# Patient Record
Sex: Female | Born: 1990 | Race: Black or African American | Hispanic: No | Marital: Single | Smoking: Never smoker
Health system: Southern US, Community
[De-identification: ages and names within clinical notes are randomized; demographics above are authoritative.]

## PROBLEM LIST (undated history)

## (undated) DIAGNOSIS — T7840XA Allergy, unspecified, initial encounter: Secondary | ICD-10-CM

## (undated) DIAGNOSIS — E282 Polycystic ovarian syndrome: Secondary | ICD-10-CM

## (undated) DIAGNOSIS — N200 Calculus of kidney: Secondary | ICD-10-CM

## (undated) DIAGNOSIS — F32A Depression, unspecified: Secondary | ICD-10-CM

## (undated) DIAGNOSIS — F419 Anxiety disorder, unspecified: Secondary | ICD-10-CM

## (undated) HISTORY — PX: APPENDECTOMY: SHX54

## (undated) HISTORY — DX: Anxiety disorder, unspecified: F41.9

## (undated) HISTORY — DX: Allergy, unspecified, initial encounter: T78.40XA

## (undated) HISTORY — DX: Depression, unspecified: F32.A

---

## 2014-12-03 ENCOUNTER — Encounter: Payer: Self-pay | Admitting: Emergency Medicine

## 2014-12-03 ENCOUNTER — Ambulatory Visit
Admission: EM | Admit: 2014-12-03 | Discharge: 2014-12-03 | Disposition: A | Discharge status: Home / Self Care | Payer: 59 | Attending: Family Medicine | Admitting: Family Medicine

## 2014-12-03 DIAGNOSIS — H6123 Impacted cerumen, bilateral: Secondary | ICD-10-CM | POA: Diagnosis not present

## 2014-12-03 NOTE — ED Provider Notes (Signed)
CSN: [id number]     Arrival date & time 12/03/14  1239 History   First MD Initiated Contact with Patient 12/03/14 1326     Chief Complaint  Patient presents with  . Otalgia    right   (Consider location/radiation/quality/duration/timing/severity/associated sxs/prior Treatment) HPI 24 year old female presents complaining of bilateral ear discomfort, worse on the right. She has a history of frequent cerumen impactions, she usually rates as this out at home with a syringe and warm water. She has not been able to fix at this time so she came in to be seen. She denies any pain or drainage. No systemic symptoms. No hearing loss  History reviewed. No pertinent past medical history. Past Surgical History  Procedure Laterality Date  . Appendectomy     History reviewed. No pertinent family history. History  Substance Use Topics  . Smoking status: Never Smoker   . Smokeless tobacco: Never Used  . Alcohol Use: Yes   OB History    No data available     Review of Systems  HENT: Negative for ear discharge, ear pain and hearing loss.        Cerumen buildup   All other systems reviewed and are negative.   Allergies  Sulfa antibiotics; Lactose intolerance (gi); and Nickel  Home Medications   Prior to Admission medications   Not on File   BP 122/75 mmHg  Pulse 83  Temp(Src) 97 F (36.1 C) (Tympanic)  Resp 16  Ht 5\' 3"  (1.6 m)  Wt 180 lb (81.647 kg)  BMI 31.89 kg/m2  SpO2 100%  LMP 12/02/2014 (Exact Date) Physical Exam  Constitutional: She is oriented to person, place, and time. Vital signs are normal. She appears well-developed and well-nourished. No distress.  HENT:  Head: Normocephalic and atraumatic.  Right Ear: External ear normal.  Left Ear: External ear normal.  Nose: Nose normal.  Mouth/Throat: Oropharynx is clear and moist. No oropharyngeal exudate.  Bilateral cerumen impaction   Pulmonary/Chest: Effort normal. No respiratory distress.  Neurological: She is alert and  oriented to person, place, and time. She has normal strength. Coordination normal.  Skin: Skin is warm and dry. No rash noted. She is not diaphoretic.  Psychiatric: She has a normal mood and affect. Judgment normal.  Nursing note and vitals reviewed.   ED Course  Procedures (including critical care time) Labs Review Labs Reviewed - No data to display  Imaging Review No results found.   MDM   1. Cerumen impaction, bilateral    Cerumen rinsed out, symptoms have resolved. No signs of any infection. Follow-up when necessary    Graylon GoodZachary H Carlus Stay, PA-C 12/03/14 1343

## 2014-12-03 NOTE — Discharge Instructions (Signed)
Cerumen Impaction °A cerumen impaction is when the wax in your ear forms a plug. This plug usually causes reduced hearing. Sometimes it also causes an earache or dizziness. Removing a cerumen impaction can be difficult and painful. The wax sticks to the ear canal. The canal is sensitive and bleeds easily. If you try to remove a heavy wax buildup with a cotton tipped swab, you may push it in further. °Irrigation with water, suction, and small ear curettes may be used to clear out the wax. If the impaction is fixed to the skin in the ear canal, ear drops may be needed for a few days to loosen the wax. People who build up a lot of wax frequently can use ear wax removal products available in your local drugstore. °SEEK MEDICAL CARE IF:  °You develop an earache, increased hearing loss, or marked dizziness. °Document Released: 07/29/2004 Document Revised: 09/13/2011 Document Reviewed: 09/18/2009 °ExitCare® Patient Information ©2015 [employer]. This information is not intended to replace advice given to you by your health care provider. Make sure you discuss any questions you have with your health care provider. ° °

## 2014-12-03 NOTE — ED Notes (Signed)
Patient c/o right ear pain for three days.  Patient denies fevers.

## 2016-11-26 ENCOUNTER — Encounter (HOSPITAL_COMMUNITY): Payer: Self-pay | Admitting: Emergency Medicine

## 2016-11-26 ENCOUNTER — Emergency Department (HOSPITAL_COMMUNITY)
Admission: EM | Admit: 2016-11-26 | Discharge: 2016-11-26 | Disposition: A | Discharge status: Home / Self Care | Payer: 59 | Attending: Emergency Medicine | Admitting: Emergency Medicine

## 2016-11-26 DIAGNOSIS — H6691 Otitis media, unspecified, right ear: Secondary | ICD-10-CM | POA: Insufficient documentation

## 2016-11-26 DIAGNOSIS — H9201 Otalgia, right ear: Secondary | ICD-10-CM | POA: Diagnosis present

## 2016-11-26 DIAGNOSIS — H60501 Unspecified acute noninfective otitis externa, right ear: Secondary | ICD-10-CM

## 2016-11-26 MED ORDER — AMOXICILLIN 500 MG PO CAPS: 500.0000 mg | ORAL_CAPSULE | Freq: Three times a day (TID) | ORAL | 0 refills | Status: DC

## 2016-11-26 MED ORDER — CIPROFLOXACIN-DEXAMETHASONE 0.3-0.1 % OT SUSP: 4.0000 [drp] | Freq: Two times a day (BID) | OTIC | 0 refills | Status: DC

## 2016-11-26 NOTE — ED Triage Notes (Signed)
Pt reports right sided ear pain that started on Monday and became worse tonight. Pt reports using ear ache drops from [facility] with no relief.

## 2016-11-26 NOTE — ED Notes (Signed)
Patient complaining of right ear pain that started a half hour ago. Patient states pain is 6/10. Patient has not had any injury to right ear.

## 2016-11-26 NOTE — ED Provider Notes (Signed)
[facility]-EMERGENCY DEPT Provider Note   CSN: [id number] Arrival date & time: 11/26/16  0343     History   Chief Complaint Chief Complaint  Patient presents with  . Otalgia    HPI Rebekah Fields is a 26 y.o. female.  Patient presents to the emergency department with chief complaint of right-sided ear pain. She states the pain started on Monday and has gradually worsened. Tonight it awakened her from sleep with severe pain. She denies having gone swimming. She denies any fevers, chills, or other associated symptoms. She has not taken anything for symptoms.    The history is provided by the patient. No language interpreter was used.    History reviewed. No pertinent past medical history.  There are no active problems to display for this patient.   Past Surgical History:  Procedure Laterality Date  . APPENDECTOMY      OB History    No data available       Home Medications    Prior to Admission medications   Not on File    Family History History reviewed. No pertinent family history.  Social History Social History  Substance Use Topics  . Smoking status: Never Smoker  . Smokeless tobacco: Never Used  . Alcohol use Yes     Allergies   Sulfa antibiotics; Lactose intolerance (gi); and Nickel   Review of Systems Review of Systems  All other systems reviewed and are negative.    Physical Exam Updated Vital Signs BP 125/79 (BP Location: Left Arm)   Pulse 83   Temp 98.4 F (36.9 C) (Oral)   Resp 18   Ht 5\' 3"  (1.6 m)   Wt 85.3 kg (188 lb)   LMP 11/01/2016   SpO2 100%   BMI 33.30 kg/m   Physical Exam  Constitutional: She is oriented to person, place, and time. No distress.  HENT:  Head: Normocephalic and atraumatic.  Right ear canal is edematous with debris, right TM as not visualized  Left ear canal is clear, left TM is clear  Eyes: Conjunctivae and EOM are normal. Pupils are equal, round, and reactive to light.  Neck: No tracheal deviation  present.  Cardiovascular: Normal rate.   Pulmonary/Chest: Effort normal. No respiratory distress.  Abdominal: Soft.  Musculoskeletal: Normal range of motion.  Neurological: She is alert and oriented to person, place, and time.  Skin: Skin is warm and dry. She is not diaphoretic.  Psychiatric: Judgment normal.  Nursing note and vitals reviewed.    ED Treatments / Results  Labs (all labs ordered are listed, but only abnormal results are displayed) Labs Reviewed - No data to display  EKG  EKG Interpretation None       Radiology No results found.  Procedures Procedures (including critical care time)  Medications Ordered in ED Medications - No data to display   Initial Impression / Assessment and Plan / ED Course  I have reviewed the triage vital signs and the nursing notes.  Pertinent labs & imaging results that were available during my care of the patient were reviewed by me and considered in my medical decision making (see chart for details).     Patient with right-sided otalgia. Physical exam concerning for otitis externa, I am unable to visualize the tympanic membrane, and will treat with amoxicillin.  Recommend PCP follow-up.  Final Clinical Impressions(s) / ED Diagnoses   Final diagnoses:  Acute otitis externa of right ear, unspecified type  Right ear pain    New Prescriptions  New Prescriptions   AMOXICILLIN (AMOXIL) 500 MG CAPSULE    Take 1 capsule (500 mg total) by mouth 3 (three) times daily.   CIPROFLOXACIN-DEXAMETHASONE (CIPRODEX) OTIC SUSPENSION    Place 4 drops into the right ear 2 (two) times daily.     Roxy HorsemanBrowning, Minervia Osso, PA-C 11/26/16 [id number]    Gilda CreasePollina, Christopher J, MD 11/26/16 [phone]

## 2017-05-30 ENCOUNTER — Emergency Department (HOSPITAL_COMMUNITY): Payer: 59

## 2017-05-30 ENCOUNTER — Encounter (HOSPITAL_COMMUNITY): Payer: Self-pay

## 2017-05-30 ENCOUNTER — Emergency Department (HOSPITAL_COMMUNITY)
Admission: EM | Admit: 2017-05-30 | Discharge: 2017-05-30 | Disposition: A | Discharge status: Home / Self Care | Payer: 59 | Attending: Emergency Medicine | Admitting: Emergency Medicine

## 2017-05-30 DIAGNOSIS — Z79899 Other long term (current) drug therapy: Secondary | ICD-10-CM | POA: Diagnosis not present

## 2017-05-30 DIAGNOSIS — R109 Unspecified abdominal pain: Secondary | ICD-10-CM | POA: Diagnosis present

## 2017-05-30 DIAGNOSIS — N200 Calculus of kidney: Secondary | ICD-10-CM | POA: Insufficient documentation

## 2017-05-30 LAB — URINALYSIS, ROUTINE W REFLEX MICROSCOPIC
Bilirubin Urine: NEGATIVE
GLUCOSE, UA: NEGATIVE mg/dL
Ketones, ur: NEGATIVE mg/dL
NITRITE: NEGATIVE
Protein, ur: NEGATIVE mg/dL
Specific Gravity, Urine: <1.005 — ABNORMAL LOW (ref 1.005–1.030)
pH: 6 (ref 5.0–8.0)

## 2017-05-30 LAB — CBC
HEMATOCRIT: 39.6 % (ref 36.0–46.0)
Hemoglobin: 13.2 g/dL (ref 12.0–15.0)
MCH: 28.7 pg (ref 26.0–34.0)
MCHC: 33.3 g/dL (ref 30.0–36.0)
MCV: 86.1 fL (ref 78.0–100.0)
Platelets: 383 10*3/uL (ref 150–400)
RBC: 4.6 MIL/uL (ref 3.87–5.11)
RDW: 12.7 % (ref 11.5–15.5)
WBC: 10.2 10*3/uL (ref 4.0–10.5)

## 2017-05-30 LAB — URINALYSIS, MICROSCOPIC (REFLEX)

## 2017-05-30 LAB — BASIC METABOLIC PANEL
Anion gap: 7 (ref 5–15)
BUN: 15 mg/dL (ref 6–20)
CHLORIDE: 103 mmol/L (ref 101–111)
CO2: 26 mmol/L (ref 22–32)
Calcium: 9.6 mg/dL (ref 8.9–10.3)
Creatinine, Ser: 0.74 mg/dL (ref 0.44–1.00)
Glucose, Bld: 107 mg/dL — ABNORMAL HIGH (ref 65–99)
POTASSIUM: 3.9 mmol/L (ref 3.5–5.1)
SODIUM: 136 mmol/L (ref 135–145)

## 2017-05-30 LAB — HCG, QUANTITATIVE, PREGNANCY

## 2017-05-30 MED ORDER — KETOROLAC TROMETHAMINE 60 MG/2ML IM SOLN
30.0000 mg | Freq: Once | INTRAMUSCULAR | Status: AC
  Administered 2017-05-30: 30 mg via INTRAMUSCULAR
  Filled 2017-05-30: qty 2

## 2017-05-30 MED ORDER — DICLOFENAC SODIUM ER 100 MG PO TB24: 100.0000 mg | ORAL_TABLET | Freq: Every day | ORAL | 0 refills | Status: DC

## 2017-05-30 MED ORDER — TAMSULOSIN HCL 0.4 MG PO CAPS
0.4000 mg | ORAL_CAPSULE | Freq: Every day | ORAL | Status: DC
  Administered 2017-05-30: 0.4 mg via ORAL
  Filled 2017-05-30: qty 1

## 2017-05-30 NOTE — ED Triage Notes (Signed)
Pt went to the restroom in triage with a strainer and she has two specks in the strainer and she feels better

## 2017-05-30 NOTE — ED Notes (Signed)
Bed: [facility] Expected date:  Expected time:  Means of arrival:  Comments: 

## 2017-05-30 NOTE — ED Notes (Signed)
Pt complains of sharp flank pain that woke her up about 2 hours ago, pt has hx of kidney stones Pt vomited once, denies hematuria

## 2017-05-30 NOTE — ED Provider Notes (Signed)
[facility]-EMERGENCY DEPT Provider Note   CSN: [id number] Arrival date & time: 05/30/17  0219     History   Chief Complaint Chief Complaint  Patient presents with  . Flank Pain    HPI Rebekah Fields is a 26 y.o. female.  The history is provided by the patient.  Flank Pain  This is a recurrent problem. The current episode started 1 to 2 hours ago. The problem occurs constantly. The problem has been resolved. Pertinent negatives include no chest pain, no abdominal pain, no headaches and no shortness of breath. Nothing aggravates the symptoms. Nothing relieves the symptoms. She has tried nothing for the symptoms. The treatment provided no relief.  Passed the stone while waiting.  Pain has resolved.    History reviewed. No pertinent past medical history.  There are no active problems to display for this patient.   Past Surgical History:  Procedure Laterality Date  . APPENDECTOMY      OB History    No data available       Home Medications    Prior to Admission medications   Medication Sig Start Date End Date Taking? Authorizing Provider  Diclofenac Sodium CR (VOLTAREN-XR) 100 MG 24 hr tablet Take 1 tablet (100 mg total) by mouth daily. 05/30/17   Monti Jilek, MD    Family History History reviewed. No pertinent family history.  Social History Social History   Tobacco Use  . Smoking status: Never Smoker  . Smokeless tobacco: Never Used  Substance Use Topics  . Alcohol use: Yes  . Drug use: No     Allergies   Sulfa antibiotics; Lactose intolerance (gi); and Nickel   Review of Systems Review of Systems  Constitutional: Negative for fever.  Respiratory: Negative for shortness of breath.   Cardiovascular: Negative for chest pain.  Gastrointestinal: Positive for vomiting. Negative for abdominal pain.  Genitourinary: Positive for flank pain. Negative for frequency.  Neurological: Negative for headaches.  All other systems  reviewed and are negative.    Physical Exam Updated Vital Signs BP 111/74 (BP Location: Left Arm)   Pulse 86   Temp 98.6 F (37 C) (Oral)   Resp 18   SpO2 99%   Physical Exam  Constitutional: She is oriented to person, place, and time. She appears well-developed and well-nourished. No distress.  HENT:  Head: Normocephalic and atraumatic.  Mouth/Throat: No oropharyngeal exudate.  Eyes: Conjunctivae are normal. Pupils are equal, round, and reactive to light.  Neck: Normal range of motion. Neck supple. No JVD present.  Cardiovascular: Normal rate, regular rhythm, normal heart sounds and intact distal pulses.  Pulmonary/Chest: Effort normal and breath sounds normal. No stridor. She has no wheezes. She has no rales.  Abdominal: Soft. Bowel sounds are normal. She exhibits no mass. There is no tenderness. There is no rebound and no guarding.  Musculoskeletal: Normal range of motion.  Neurological: She is alert and oriented to person, place, and time.  Skin: Skin is warm and dry. Capillary refill takes less than 2 seconds.  Psychiatric: She has a normal mood and affect.     ED Treatments / Results   Vitals:   05/30/17 0221  BP: 111/74  Pulse: 86  Resp: 18  Temp: 98.6 F (37 C)  SpO2: 99%    Labs (all labs ordered are listed, but only abnormal results are displayed)  Results for orders placed or performed during the hospital encounter of 05/30/17  Urinalysis, Routine w reflex microscopic- may I&O cath  if menses  Result Value Ref Range   Color, Urine YELLOW YELLOW   APPearance CLEAR CLEAR   Specific Gravity, Urine <1.005 (L) 1.005 - 1.030   pH 6.0 5.0 - 8.0   Glucose, UA NEGATIVE NEGATIVE mg/dL   Hgb urine dipstick LARGE (A) NEGATIVE   Bilirubin Urine NEGATIVE NEGATIVE   Ketones, ur NEGATIVE NEGATIVE mg/dL   Protein, ur NEGATIVE NEGATIVE mg/dL   Nitrite NEGATIVE NEGATIVE   Leukocytes, UA TRACE (A) NEGATIVE  hCG, quantitative, pregnancy  Result Value Ref Range   hCG,  Beta Chain, Quant, S <1 <5 mIU/mL  Basic metabolic panel  Result Value Ref Range   Sodium 136 135 - 145 mmol/L   Potassium 3.9 3.5 - 5.1 mmol/L   Chloride 103 101 - 111 mmol/L   CO2 26 22 - 32 mmol/L   Glucose, Bld 107 (H) 65 - 99 mg/dL   BUN 15 6 - 20 mg/dL   Creatinine, Ser 1.61 0.44 - 1.00 mg/dL   Calcium 9.6 8.9 - 09.6 mg/dL   GFR calc non Af Amer >60 >60 mL/min   GFR calc Af Amer >60 >60 mL/min   Anion gap 7 5 - 15  CBC  Result Value Ref Range   WBC 10.2 4.0 - 10.5 K/uL   RBC 4.60 3.87 - 5.11 MIL/uL   Hemoglobin 13.2 12.0 - 15.0 g/dL   HCT 04.5 40.9 - 81.1 %   MCV 86.1 78.0 - 100.0 fL   MCH 28.7 26.0 - 34.0 pg   MCHC 33.3 30.0 - 36.0 g/dL   RDW 91.4 78.2 - 95.6 %   Platelets 383 150 - 400 K/uL  Urinalysis, Microscopic (reflex)  Result Value Ref Range   RBC / HPF 6-30 0 - 5 RBC/hpf   WBC, UA 0-5 0 - 5 WBC/hpf   Bacteria, UA RARE (A) NONE SEEN   Squamous Epithelial / LPF 0-5 (A) NONE SEEN   Ct Renal Stone Study  Result Date: 05/30/2017 CLINICAL DATA:  Left flank pain, vomiting and hematuria EXAM: CT ABDOMEN AND PELVIS WITHOUT CONTRAST TECHNIQUE: Multidetector CT imaging of the abdomen and pelvis was performed following the standard protocol without IV contrast. COMPARISON:  None. FINDINGS: Lower chest: No pulmonary nodules or pleural effusion. No visible pericardial effusion. Hepatobiliary: Normal hepatic contours and density. No visible biliary dilatation. Normal gallbladder. Pancreas: Normal contours without ductal dilatation. No peripancreatic fluid collection. Spleen: Normal. Adrenals/Urinary Tract: --Adrenal glands: Normal. --Right kidney/ureter: There are approximately 4 nonobstructive right renal calculi, the largest of which is in the interpolar region and measures 4 mm. There is no hydronephrosis. The right ureter is of normal caliber and is unobstructed. --Left kidney/ureter: There is a nonobstructing calculus at the upper pole the left kidney, measuring up to 4 mm.  There is no left hydronephrosis or perinephric stranding. The left ureter is unobstructed. There is a phlebolith adjacent to the distal left ureter, but no ureterolithiasis. --Urinary bladder: Unremarkable. Stomach/Bowel: --Stomach/Duodenum: No hiatal hernia or other gastric abnormality. Normal duodenal course and caliber. --Small bowel: No dilatation or inflammation. --Colon: No focal abnormality. --Appendix: Surgically absent. Vascular/Lymphatic: Normal course and caliber of the major abdominal vessels. No abdominal or pelvic lymphadenopathy. Reproductive: Normal uterus and ovaries. Musculoskeletal. No bony spinal canal stenosis or focal osseous abnormality. Other: None. IMPRESSION: 1. No obstructive uropathy or other acute abdominopelvic abnormality. 2. Bilateral nonobstructive nephrolithiasis with stones measuring up to 4 mm. Electronically Signed   By: Deatra Robinson M.D.   On: 05/30/2017 05:09  Radiology Ct Renal Stone Study  Result Date: 05/30/2017 CLINICAL DATA:  Left flank pain, vomiting and hematuria EXAM: CT ABDOMEN AND PELVIS WITHOUT CONTRAST TECHNIQUE: Multidetector CT imaging of the abdomen and pelvis was performed following the standard protocol without IV contrast. COMPARISON:  None. FINDINGS: Lower chest: No pulmonary nodules or pleural effusion. No visible pericardial effusion. Hepatobiliary: Normal hepatic contours and density. No visible biliary dilatation. Normal gallbladder. Pancreas: Normal contours without ductal dilatation. No peripancreatic fluid collection. Spleen: Normal. Adrenals/Urinary Tract: --Adrenal glands: Normal. --Right kidney/ureter: There are approximately 4 nonobstructive right renal calculi, the largest of which is in the interpolar region and measures 4 mm. There is no hydronephrosis. The right ureter is of normal caliber and is unobstructed. --Left kidney/ureter: There is a nonobstructing calculus at the upper pole the left kidney, measuring up to 4 mm. There is  no left hydronephrosis or perinephric stranding. The left ureter is unobstructed. There is a phlebolith adjacent to the distal left ureter, but no ureterolithiasis. --Urinary bladder: Unremarkable. Stomach/Bowel: --Stomach/Duodenum: No hiatal hernia or other gastric abnormality. Normal duodenal course and caliber. --Small bowel: No dilatation or inflammation. --Colon: No focal abnormality. --Appendix: Surgically absent. Vascular/Lymphatic: Normal course and caliber of the major abdominal vessels. No abdominal or pelvic lymphadenopathy. Reproductive: Normal uterus and ovaries. Musculoskeletal. No bony spinal canal stenosis or focal osseous abnormality. Other: None. IMPRESSION: 1. No obstructive uropathy or other acute abdominopelvic abnormality. 2. Bilateral nonobstructive nephrolithiasis with stones measuring up to 4 mm. Electronically Signed   By: Deatra RobinsonKevin  Herman M.D.   On: 05/30/2017 05:09    Procedures Procedures (including critical care time)  Medications Ordered in ED Medications  tamsulosin (FLOMAX) capsule 0.4 mg (0.4 mg Oral Given 05/30/17 0447)  ketorolac (TORADOL) injection 30 mg (30 mg Intramuscular Given 05/30/17 0447)      Final Clinical Impressions(s) / ED Diagnoses   Final diagnoses:  Kidney stone   All questions answered to the patient's satisfaction.   Strict return precautions for fever, inability to open the mouth, inability to speech, swelling behind the ear or in the neck, fevers,  global weakness, vomiting, swelling or the lips or tongue, chest pain, dyspnea on exertion,shortness of breath, persistent pain, Inability to tolerate liquids or food, cough, altered mental status or any concerns. No signs of systemic illness or infection. The patient is nontoxic-appearing on exam and vital signs are within normal limits.    I have reviewed the triage vital signs and the nursing notes. Pertinent labs &imaging results that were available during my care of the patient were  reviewed by me and considered in my medical decision making (see chart for details).  After history, exam, and medical workup I feel the patient has been appropriately medically screened and is safe for discharge home. Pertinent diagnoses were discussed with the patient. Patient was given return precautions ED Discharge Orders        Ordered    Diclofenac Sodium CR (VOLTAREN-XR) 100 MG 24 hr tablet  Daily     05/30/17 0542       Seng Larch, MD 05/30/17 [phone]

## 2017-08-30 ENCOUNTER — Encounter (HOSPITAL_COMMUNITY): Payer: Self-pay | Admitting: Emergency Medicine

## 2017-08-30 ENCOUNTER — Emergency Department (HOSPITAL_COMMUNITY)
Admission: EM | Admit: 2017-08-30 | Discharge: 2017-08-30 | Disposition: A | Discharge status: Home / Self Care | Payer: 59 | Attending: Emergency Medicine | Admitting: Emergency Medicine

## 2017-08-30 DIAGNOSIS — Z79899 Other long term (current) drug therapy: Secondary | ICD-10-CM | POA: Insufficient documentation

## 2017-08-30 DIAGNOSIS — F419 Anxiety disorder, unspecified: Secondary | ICD-10-CM | POA: Diagnosis not present

## 2017-08-30 MED ORDER — HYDROXYZINE HCL 25 MG PO TABS: 25.0000 mg | ORAL_TABLET | Freq: Four times a day (QID) | ORAL | 0 refills | Status: DC | PRN

## 2017-08-30 NOTE — Discharge Instructions (Signed)
Take Hydroxyzine as needed for anxiety or before sleep Follow up with your counselor Return if worsening

## 2017-08-30 NOTE — ED Provider Notes (Signed)
[facility]-EMERGENCY DEPT Provider Note   CSN: [id number] Arrival date & time: 08/30/17  1331     History   Chief Complaint Chief Complaint  Patient presents with  . Anxiety    HPI Rebekah Fields is a 27 y.o. female who presents with anxiety.  No significant past medical history.  She states that since Friday she has had severe recurrent panic attacks every time she goes home or thinks about going back to her apartment.  She is afraid of being alone with her thoughts and has not been able to break the cycle of anxiety.  She is very upset that she is not able to control her thoughts and states that typically she is the type of person who people come to for help.  She is tried multiple different coping techniques suggested by her mother who is a Veterinary surgeoncounselor but she is not having any success.  She went to a counselor today and after the counselor asked her about going home she had another panic attack therefore she was advised to come to the ED for medication management versus observation.  Patient denies suicidal, homicidal, audiovisual hallucinations.   she denies any specific physical complaints other than when she has the panic attack she feels like her whole body is going numb and gets very sweaty and anxious.  She denies any medication use, supplement use, drug or alcohol use.  She has never had severe anxiety like this before but states that she has had a great increase in stress in her work and personal life over the past week.  She has been going to her parents home is about 40 minutes away because she is afraid to be alone. No hx of inpatient tx in the past.  HPI  History reviewed. No pertinent past medical history.  There are no active problems to display for this patient.   Past Surgical History:  Procedure Laterality Date  . APPENDECTOMY      OB History    No data available       Home Medications    Prior to Admission medications   Medication Sig  Start Date End Date Taking? Authorizing Provider  Diclofenac Sodium CR (VOLTAREN-XR) 100 MG 24 hr tablet Take 1 tablet (100 mg total) by mouth daily. 05/30/17   Palumbo, April, MD  hydrOXYzine (ATARAX/VISTARIL) 25 MG tablet Take 1 tablet (25 mg total) by mouth every 6 (six) hours as needed. 08/30/17   Bethel BornGekas, Yaritzel Stange Marie, PA-C    Family History No family history on file.  Social History Social History   Tobacco Use  . Smoking status: Never Smoker  . Smokeless tobacco: Never Used  Substance Use Topics  . Alcohol use: Yes  . Drug use: No     Allergies   Sulfa antibiotics; Lactose intolerance (gi); and Nickel   Review of Systems Review of Systems  Constitutional: Negative for fever.  Respiratory: Negative for shortness of breath.   Cardiovascular: Negative for chest pain.  Gastrointestinal: Negative for abdominal pain.  Psychiatric/Behavioral: Positive for dysphoric mood and sleep disturbance. Negative for confusion, hallucinations, self-injury and suicidal ideas. The patient is nervous/anxious.   All other systems reviewed and are negative.    Physical Exam Updated Vital Signs BP 123/90 (BP Location: Left Arm)   Pulse 85   Temp 98.6 F (37 C) (Oral)   Resp 17   SpO2 96%   Physical Exam  Constitutional: She is oriented to person, place, and time. She appears well-developed  and well-nourished. No distress.  Tearful at times  HENT:  Head: Normocephalic and atraumatic.  Eyes: Conjunctivae are normal. Pupils are equal, round, and reactive to light. Right eye exhibits no discharge. Left eye exhibits no discharge. No scleral icterus.  Neck: Normal range of motion.  Cardiovascular: Normal rate.  Pulmonary/Chest: Effort normal. No respiratory distress.  Abdominal: She exhibits no distension.  Neurological: She is alert and oriented to person, place, and time.  Skin: Skin is warm and dry.  Psychiatric: She has a normal mood and affect. Her behavior is normal.  Nursing note  and vitals reviewed.    ED Treatments / Results  Labs (all labs ordered are listed, but only abnormal results are displayed) Labs Reviewed - No data to display  EKG  EKG Interpretation None       Radiology No results found.  Procedures Procedures (including critical care time)  Medications Ordered in ED Medications - No data to display   Initial Impression / Assessment and Plan / ED Course  I have reviewed the triage vital signs and the nursing notes.  Pertinent labs & imaging results that were available during my care of the patient were reviewed by me and considered in my medical decision making (see chart for details).  27 year old female presents with recurrent panic attacks.  Vital signs are normal.  She is well-appearing.  She feels that her anxiety is interfering with her life and feels out of control. Reassurance was given and coping techniques were discussed. She was offered prescription for Atarax versus TTS consult and she wants to try the Atarax first.  She was advised to return if worsening.  She is also advised to follow-up with her counselor.  Final Clinical Impressions(s) / ED Diagnoses   Final diagnoses:  Anxiety    ED Discharge Orders        Ordered    hydrOXYzine (ATARAX/VISTARIL) 25 MG tablet  Every 6 hours PRN     08/30/17 1427       Bethel Born, PA-C 08/30/17 1516    Tilden Fossa, MD 08/31/17 [phone]

## 2017-08-30 NOTE — ED Triage Notes (Addendum)
Patient comes from therapist office stating that she was told to come here since she is having anxiety and panic attacks since Friday.  Patient reports that she was sleeping Friday when she woke up in panic attack and couldn't go back to sleep.  Reports that every time she has to go back to her apartment or thinks about having to go back to her apartment, she will have a panic attack.  Patient lives alone and lived in same apartment since May last year. Denies any one threatening or harming her. Just afraid that she will have another panic attack and be there alone with her thoughts. Denies SI or HI.  Pt adds that she has been driving to her parents home over 30min away to sleep since Friday.

## 2017-08-30 NOTE — ED Notes (Signed)
Bed: [facility] Expected date:  Expected time:  Means of arrival:  Comments: 

## 2017-08-31 ENCOUNTER — Encounter (HOSPITAL_COMMUNITY): Payer: Self-pay | Admitting: Emergency Medicine

## 2017-08-31 ENCOUNTER — Ambulatory Visit (HOSPITAL_COMMUNITY)
Admission: RE | Admit: 2017-08-31 | Discharge: 2017-08-31 | Disposition: A | Discharge status: Home / Self Care | Payer: 59 | Attending: Psychiatry | Admitting: Psychiatry

## 2017-08-31 DIAGNOSIS — F411 Generalized anxiety disorder: Secondary | ICD-10-CM | POA: Diagnosis not present

## 2017-08-31 NOTE — BH Assessment (Signed)
Assessment Note  Rebekah Fields is an 27 y.o. female. Patient presents voluntarily to [facility] for assessment. She is pleasant and oriented x 4. She reports she has experienced severe anxiety with panic attacks since 08/25/17. 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, Consulting civil engineer apartment complex. She says they have been short staffed at work, so work has been more stressful than usual. Pt reports in the past week, her car has broken down twice and she passed a kidney stone at work. She reports she has experiencing anxiety in the past but nothing as intense as the past week. Pt says she saw a therapist a couple of times during college after a coworker committed suicide. Pt says the only other time she has been a counselor was her initial appointment yesterday with Rebekah Fields LPC. She reports when Rebekah Fields began talking about patient going back to her apartment to sleep, pt had a panic attack during therapy session. Pt says she has been driving 40 mins to her parents' home every night this week rather than sleeping alone in her apartment. Pt says that she has a fear of going home to her apartment and something happening to her and no one would be there to help her. She says she has had a fear of death since suicide of her coworker few years ago. Pt reports she had two panic attacks today. Per chart review, pt presented to [facility] last night with panic attack. She says her counselor encouraged her to go to ED as pt would be able to be prescribed meds for anxiety. Pt says she took the hydroxyzine last night which made her sleepy. Pt says she was able to sleep at her apartment last night since she was so drowsy. She reports during her panic attacks she cries uncontrollably and her legs feel numb and she feels terrified. Pt denies any current or past substance abuse problems. Pt does not appear to be intoxicated or in withdrawal at this time. Pt denies homicidal thoughts or physical aggression. Pt denies having access to firearms.  Pt denies having any legal problems at this time. Pt denies hallucinations. Pt does not appear to be responding to internal stimuli and exhibits no delusional thought. Pt's reality testing appears to be intact. She reports interest in med management for anxiety.  Collateral info provided by pt's mother Rebekah Fields who is present during assessment. 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 Mom says she has been working with patient this week and coping techniques and identifying triggers. Mom is therapist with student services.   Diagnosis: Generalized Anxiety Disorder with Panic Attacks  Past Medical History: No past medical history on file.  Past Surgical History:  Procedure Laterality Date  . APPENDECTOMY      Family History: No family history on file.  Social History:  reports that  has never smoked. she has never used smokeless tobacco. She reports that she does not drink alcohol or use drugs.  Additional Social History:  Alcohol / Drug Use Pain Medications: pt denies abuse - see pta meds list Prescriptions: pt denies abuse - see pta meds list Over the Counter: pt denies abuse - see pta meds list History of alcohol / drug use?: No history of alcohol / drug abuse Longest period of sobriety (when/how long): none  CIWA:   COWS:    Allergies:  Allergies  Allergen Reactions  . Sulfa Antibiotics Swelling  . Lactose Intolerance (Gi) Other (See Comments)  diarrhea  . Nickel Itching    Home Medications:  (Not in a hospital admission)  OB/GYN Status:  No LMP recorded.  General Assessment Data [facility] Assessment: In system Is this a Tele or Face-to-Face Assessment?: Face-to-Face Is this an Initial Assessment or a Re-assessment for this encounter?: Initial Assessment Marital status: Single Maiden name: Rebekah Fields Is patient pregnant?: No Pregnancy Status: No Living Arrangements:  Alone Can pt return to current living arrangement?: Yes Admission Status: Voluntary Is patient capable of signing voluntary admission?: Yes Referral Source: Self/Family/Friend Insurance type: united healthcare  Medical Screening Exam [facility] Walk-in ONLY) Medical Exam completed: Yes(Rebekah simon pa-c)  Crisis Care Plan Living Arrangements: Alone Name of Psychiatrist: none Name of Therapist: [facility]  Education Status Is patient currently in school?: No Highest grade of school patient has completed: 16(plus graduate certificate in Health ) Name of school: [employer]   Risk to self with the past 6 months Suicidal Ideation: No Has patient been a risk to self within the past 6 months prior to admission? : No Suicidal Intent: No Has patient had any suicidal intent within the past 6 months prior to admission? : No Is patient at risk for suicide?: No Suicidal Plan?: No Has patient had any suicidal plan within the past 6 months prior to admission? : No Access to Means: No What has been your use of drugs/alcohol within the last 12 months?: none Previous Attempts/Gestures: No How many times?: 0 Other Self Harm Risks: none Triggers for Past Attempts: (n/a) Intentional Self Injurious Behavior: None Family Suicide History: No Recent stressful life event(s): Other (Comment)(short staffed at work, kidney stone, car broke down twice) Persecutory voices/beliefs?: No Depression: No Depression Symptoms: Feeling angry/irritable Substance abuse history and/or treatment for substance abuse?: No Suicide prevention information given to non-admitted patients: Not applicable  Risk to Others within the past 6 months Homicidal Ideation: No Does patient have any lifetime risk of violence toward others beyond the six months prior to admission? : No Thoughts of Harm to Others: No Current Homicidal Intent: No Current Homicidal Plan: No Access to Homicidal Means: No Identified Victim:  none History of harm to others?: No Assessment of Violence: None Noted Violent Behavior Description: pt denies history of violence Does patient have access to weapons?: No Criminal Charges Pending?: No Does patient have a court date: No Is patient on probation?: No  Psychosis Hallucinations: None noted Delusions: None noted  Mental Status Report Appearance/Hygiene: Unremarkable(in street clothes appropriate for weather) Eye Contact: Good Motor Activity: Freedom of movement Speech: Logical/coherent Level of Consciousness: Alert Mood: Anxious Affect: Appropriate to circumstance, Anxious(euthymic) Anxiety Level: Panic Attacks Panic attack frequency: daily Most recent panic attack: two attacks today Thought Processes: Relevant, Coherent Judgement: Unimpaired Orientation: Person, Place, Situation, Time Obsessive Compulsive Thoughts/Behaviors: None  Cognitive Functioning Concentration: Normal Memory: Remote Intact, Recent Intact IQ: Average Insight: Good Impulse Control: Fair Appetite: Poor Sleep: No Change Vegetative Symptoms: None  ADLScreening [facility] Assessment Services) Patient's cognitive ability adequate to safely complete daily activities?: Yes Patient able to express need for assistance with ADLs?: Yes Independently performs ADLs?: Yes (appropriate for developmental age)  Prior Inpatient Therapy Prior Inpatient Therapy: No  Prior Outpatient Therapy Prior Outpatient Therapy: Yes Prior Therapy Dates: briefly a few years ago and today Prior Therapy Facilty/Provider([facility] Reason for Treatment: anxiety, panic attacks Does patient have an ACCT team?: No Does patient have Intensive In-House Services?  : No Does patient have  [facility] services? : No Does patient have P4CC services?: No  ADL Screening (condition at time of admission) Patient's cognitive ability adequate to safely complete daily activities?: Yes Is the patient deaf or have difficulty  hearing?: No Does the patient have difficulty seeing, even when wearing glasses/contacts?: No Does the patient have difficulty concentrating, remembering, or making decisions?: No Patient able to express need for assistance with ADLs?: Yes Does the patient have difficulty dressing or bathing?: No Independently performs ADLs?: Yes (appropriate for developmental age) Does the patient have difficulty walking or climbing stairs?: No Weakness of Legs: None Weakness of Arms/Hands: None  Home Assistive Devices/Equipment Home Assistive Devices/Equipment: Eyeglasses    Abuse/Neglect Assessment (Assessment to be complete while patient is alone) Abuse/Neglect Assessment Can Be Completed: Yes Physical Abuse: Denies Verbal Abuse: Denies Sexual Abuse: Denies Exploitation of patient/patient's resources: Denies Self-Neglect: Denies     Merchant navy officer (For Healthcare) Does Patient Have a Medical Advance Directive?: No Would patient like information on creating a medical advance directive?: No - Patient declined    Additional Information 1:1 In Past 12 Months?: No CIRT Risk: No Elopement Risk: No Does patient have medical clearance?: No     Disposition:  Disposition Initial Assessment Completed for this Encounter: Yes Disposition of Patient: Outpatient treatment Type of outpatient treatment: Adult  Donell Sievert PA-C recommended outpatient treatment for pt as she doesn't meet inpatient criteria.  Upon Fields's discussion with patient, patient agrees to start med management with Melvenia Beam as he can begin med management with her quicker than the typical wait time to see a new mental health provider for med management. The plan is for Fields's office to contact pt directly by phone.    On Site Evaluation by:   Reviewed with Physician:    Thornell Sartorius 08/31/2017 10:44 PM

## 2017-08-31 NOTE — H&P (Signed)
Behavioral Health Medical Screening Exam  Rebekah Fields is an 27 y.o. female, presenting with her mother with c/o of increasing GAD symptoms , that include panic attacks, worrying and ruminating. She denies any acute ailments and or medical concerns. She denies depression, SI/SA or HI.  Total Time spent with patient: 20 minutes  Psychiatric Specialty Exam: Physical Exam  Constitutional: She is oriented to person, place, and time. She appears well-developed and well-nourished. No distress.  HENT:  Head: Normocephalic.  Eyes: Pupils are equal, round, and reactive to light.  Respiratory: Effort normal and breath sounds normal.  Neurological: She is alert and oriented to person, place, and time. No cranial nerve deficit.  Skin: Skin is warm and dry. She is not diaphoretic.  Psychiatric: Her speech is normal and behavior is normal. Judgment normal. Her mood appears anxious. Cognition and memory are normal. She expresses no homicidal and no suicidal ideation. She expresses no suicidal plans and no homicidal plans.    Review of Systems  Constitutional: Negative for chills, diaphoresis, fever, malaise/fatigue and weight loss.  Respiratory: Positive for shortness of breath.   Cardiovascular: Positive for palpitations.  Gastrointestinal: Positive for nausea.  Neurological: Positive for tingling. Negative for weakness.  Psychiatric/Behavioral: Negative for depression, hallucinations, substance abuse and suicidal ideas. The patient is nervous/anxious and has insomnia.     There were no vitals taken for this visit.There is no height or weight on file to calculate BMI.  General Appearance: Casual  Eye Contact:  Good  Speech:  Clear and Coherent  Volume:  Normal  Mood:  Anxious  Affect:  Congruent  Thought Process:  Goal Directed  Orientation:  Full (Time, Place, and Person)  Thought Content:  Logical  Suicidal Thoughts:  No  Homicidal Thoughts:  No  Memory:  Immediate;   Good  Judgement:   Good  Insight:  Fair  Psychomotor Activity:  Negative  Concentration: Concentration: Good  Recall:  Good  Fund of Knowledge:Good  Language: Good  Akathisia:  Negative  Handed:  Right  AIMS (if indicated):     Assets:  Desire for Improvement  Sleep:       Musculoskeletal: Strength & Muscle Tone: within normal limits Gait & Station: normal Patient leans: N/A  There were no vitals taken for this visit.  Recommendations:  Based on my evaluation the patient does not appear to have an emergency medical condition.  Kerry HoughSpencer E Laurabelle Gorczyca, PA-C 08/31/2017, 9:52 PM

## 2017-09-19 ENCOUNTER — Ambulatory Visit (HOSPITAL_COMMUNITY)
Admission: EM | Admit: 2017-09-19 | Discharge: 2017-09-19 | Disposition: A | Discharge status: Home / Self Care | Payer: 59 | Attending: Urgent Care | Admitting: Urgent Care

## 2017-09-19 ENCOUNTER — Encounter (HOSPITAL_COMMUNITY): Payer: Self-pay | Admitting: Emergency Medicine

## 2017-09-19 DIAGNOSIS — F419 Anxiety disorder, unspecified: Secondary | ICD-10-CM | POA: Diagnosis not present

## 2017-09-19 DIAGNOSIS — Z760 Encounter for issue of repeat prescription: Secondary | ICD-10-CM

## 2017-09-19 MED ORDER — HYDROXYZINE HCL 25 MG PO TABS: 25.0000 mg | ORAL_TABLET | Freq: Four times a day (QID) | ORAL | 0 refills | Status: DC | PRN

## 2017-09-19 NOTE — Discharge Instructions (Signed)
[facility] [address] [place], [place] [address]   Barbara Farran [phone]  Kathy Kirstner [phone]  Susan Kroll-Smith [phone]   [facility] (Beth Kincaid, Ernest McCoy, Heather Kitchens, Karla Townsend) - [phone]  [facility] (Julie Whitt) - [phone]  [facility] - [phone]  [facility] - [phone]  Bob Mylan - [phone]  [facility]  - [phone] (do not file insurance)   

## 2017-09-19 NOTE — ED Triage Notes (Signed)
Pt ran out of her hydroxyzine and would like a refill. Pt taking it for anxiety, has appt with psychiatrist next week.

## 2017-09-19 NOTE — ED Provider Notes (Signed)
  MRN: [id number] DOB: 04-05-1991  Subjective:   Rebekah Fields is a 27 y.o. female presenting for refill of hydroxyzine. She has been using this successfully for management of anxiety. Has a fear of dying/death. She is working closely with a psychiatrist, does not have a therapist. Denies HI, SI.   No current facility-administered medications for this encounter.   Current Outpatient Medications:  .  hydrOXYzine (ATARAX/VISTARIL) 25 MG tablet, Take 1 tablet (25 mg total) by mouth every 6 (six) hours as needed for anxiety., Disp: 60 tablet, Rfl: 0 .  ibuprofen (ADVIL,MOTRIN) 200 MG tablet, Take 600 mg by mouth every 6 (six) hours as needed (For pain.)., Disp: , Rfl:    Rebekah Fields is allergic to sulfa antibiotics; lactose intolerance (gi); and nickel.  Rebekah Fields  has no past medical history on file. Also  has a past surgical history that includes Appendectomy.  Objective:   Vitals: BP 137/85   Pulse 85   Temp 98.7 F (37.1 C)   Resp 18   LMP 09/01/2017   SpO2 100%   Physical Exam  Constitutional: She is oriented to person, place, and time. She appears well-developed and well-nourished.  Cardiovascular: Normal rate.  Pulmonary/Chest: Effort normal.  Neurological: She is alert and oriented to person, place, and time.  Skin: Skin is warm and dry.  Psychiatric: She has a normal mood and affect.   Assessment and Plan :   Anxiety  Medication refill  Stable, refilled hydroxyzine. Counseled patient on potential for adverse effects with medications prescribed today, patient verbalized understanding. Recommended she also try behavioral therapy and provided her with information for this. Return-to-clinic precautions discussed, patient verbalized understanding.    Wallis BambergMani, Alp Goldwater, [place] JerseyPA-C 09/19/17 2254

## 2018-11-08 ENCOUNTER — Emergency Department (HOSPITAL_COMMUNITY): Payer: 59

## 2018-11-08 ENCOUNTER — Emergency Department (HOSPITAL_COMMUNITY)
Admission: EM | Admit: 2018-11-08 | Discharge: 2018-11-08 | Disposition: A | Discharge status: Home / Self Care | Payer: 59 | Attending: Emergency Medicine | Admitting: Emergency Medicine

## 2018-11-08 ENCOUNTER — Other Ambulatory Visit: Payer: Self-pay

## 2018-11-08 ENCOUNTER — Encounter (HOSPITAL_COMMUNITY): Payer: Self-pay | Admitting: Emergency Medicine

## 2018-11-08 DIAGNOSIS — N200 Calculus of kidney: Secondary | ICD-10-CM | POA: Diagnosis not present

## 2018-11-08 DIAGNOSIS — R1032 Left lower quadrant pain: Secondary | ICD-10-CM | POA: Diagnosis present

## 2018-11-08 HISTORY — DX: Polycystic ovarian syndrome: E28.2

## 2018-11-08 HISTORY — DX: Calculus of kidney: N20.0

## 2018-11-08 LAB — URINALYSIS, ROUTINE W REFLEX MICROSCOPIC
Bilirubin Urine: NEGATIVE
Glucose, UA: NEGATIVE mg/dL
Ketones, ur: 80 mg/dL — AB
Nitrite: NEGATIVE
Protein, ur: NEGATIVE mg/dL
Specific Gravity, Urine: 1.014 (ref 1.005–1.030)
pH: 7 (ref 5.0–8.0)

## 2018-11-08 LAB — CBC
HCT: 40.6 % (ref 36.0–46.0)
Hemoglobin: 12.8 g/dL (ref 12.0–15.0)
MCH: 27.5 pg (ref 26.0–34.0)
MCHC: 31.5 g/dL (ref 30.0–36.0)
MCV: 87.1 fL (ref 80.0–100.0)
Platelets: 397 10*3/uL (ref 150–400)
RBC: 4.66 MIL/uL (ref 3.87–5.11)
RDW: 12.2 % (ref 11.5–15.5)
WBC: 10 10*3/uL (ref 4.0–10.5)
nRBC: 0 % (ref 0.0–0.2)

## 2018-11-08 LAB — COMPREHENSIVE METABOLIC PANEL
ALT: 17 U/L (ref 0–44)
AST: 18 U/L (ref 15–41)
Albumin: 4 g/dL (ref 3.5–5.0)
Alkaline Phosphatase: 44 U/L (ref 38–126)
Anion gap: 12 (ref 5–15)
BUN: 12 mg/dL (ref 6–20)
CO2: 21 mmol/L — ABNORMAL LOW (ref 22–32)
Calcium: 9.3 mg/dL (ref 8.9–10.3)
Chloride: 104 mmol/L (ref 98–111)
Creatinine, Ser: 0.91 mg/dL (ref 0.44–1.00)
GFR calc Af Amer: >60 mL/min (ref >60)
GFR calc non Af Amer: >60 mL/min (ref >60)
Glucose, Bld: 123 mg/dL — ABNORMAL HIGH (ref 70–99)
Potassium: 3.2 mmol/L — ABNORMAL LOW (ref 3.5–5.1)
Sodium: 137 mmol/L (ref 135–145)
Total Bilirubin: 0.4 mg/dL (ref 0.3–1.2)
Total Protein: 8.4 g/dL — ABNORMAL HIGH (ref 6.5–8.1)

## 2018-11-08 LAB — LIPASE, BLOOD: Lipase: 35 U/L (ref 11–51)

## 2018-11-08 LAB — I-STAT BETA HCG BLOOD, ED (MC, WL, AP ONLY): I-stat hCG, quantitative: <5 m[IU]/mL (ref <5)

## 2018-11-08 MED ORDER — MORPHINE SULFATE (PF) 4 MG/ML IV SOLN
4.0000 mg | Freq: Once | INTRAVENOUS | Status: AC
  Administered 2018-11-08: 4 mg via INTRAVENOUS
  Filled 2018-11-08: qty 1

## 2018-11-08 MED ORDER — KETOROLAC TROMETHAMINE 30 MG/ML IJ SOLN
30.0000 mg | Freq: Once | INTRAMUSCULAR | Status: AC
  Administered 2018-11-08: 30 mg via INTRAVENOUS
  Filled 2018-11-08: qty 1

## 2018-11-08 MED ORDER — TAMSULOSIN HCL 0.4 MG PO CAPS: 0.4000 mg | ORAL_CAPSULE | Freq: Every day | ORAL | 0 refills | Status: AC

## 2018-11-08 MED ORDER — HYDROCODONE-ACETAMINOPHEN 5-325 MG PO TABS: 2.0000 | ORAL_TABLET | ORAL | 0 refills | Status: AC | PRN

## 2018-11-08 MED ORDER — ONDANSETRON HCL 4 MG/2ML IJ SOLN
4.0000 mg | Freq: Once | INTRAMUSCULAR | Status: AC
  Administered 2018-11-08: 4 mg via INTRAVENOUS
  Filled 2018-11-08: qty 2

## 2018-11-08 MED ORDER — LORAZEPAM 2 MG/ML IJ SOLN: 2.0000 mg | Freq: Once | INTRAMUSCULAR | Status: DC

## 2018-11-08 MED ORDER — ONDANSETRON HCL 4 MG PO TABS: 4.0000 mg | ORAL_TABLET | Freq: Four times a day (QID) | ORAL | 0 refills | Status: AC

## 2018-11-08 MED ORDER — SODIUM CHLORIDE 0.9% FLUSH
3.0000 mL | Freq: Once | INTRAVENOUS | Status: AC
  Administered 2018-11-08: 3 mL via INTRAVENOUS

## 2018-11-08 NOTE — ED Provider Notes (Signed)
  Physical Exam  BP 127/89 (BP Location: Right Arm)   Pulse 98   Temp 98.1 F (36.7 C) (Oral)   Resp 14   Ht 5\' 3"  (1.6 m)   Wt 85.3 kg   LMP 10/25/2018   SpO2 98%   BMI 33.30 kg/m   Physical Exam  ED Course/Procedures     Procedures  MDM  Patient care received from Graciella Freer PA at shift change, please see her note for a full HPI. Briefly, patient with a previous of history of kidney stones, presents with LLQ tenderness, reports feeling similar as her previous kidney stones. Denies any gynecological complaints. Disposition, patient pending UA, CT Renal study.  Received toradol for pain.  Will need Rx for short course of Norco.   8:04 AM Patient CT Renal results showed:  1. At least partially obstructing 4 mm stone in the left UVJ  resulting in mild left hydronephrosis, renal edema and perinephric  stranding.  2. Additional nonobstructing bilateral nephrolithiasis.  3. Hepatic steatosis.  4. Surgical changes of prior appendectomy.   UA pending at this time.  8:47 AM UA showed Large Hgb, Leukocytes large, WBC 6-10, Squamous present will sent urine for culture at this time. Nitrite negative.  9:03 AM Patient results have been discussed with patient, she has been provided a copy of her CT result. I have also prescribed Flomax to help with stone passage, zofran for her nausea along with Norco for pain control. Have prescribed muscle relaxers for your pain, please do not drink or drive while taking this medications as they can make you drowsy.  I have also prescribed steroids, he is to be advised this medication can cause insomnia, appetite changes.  These follow-up with PCP in 1 week for reevaluation of your symptoms.  You experience any bowel or bladder incontinence, fever, worsening in your symptoms please return to the ED Risk and benefits of medication therapy have been discussed with patient. She is agreeable with taking medication at this time. Voices understanding of  medication therapy. I have discussed management with Dr. Freida Busman who agrees with management.     Portions of this note were generated with Scientist, clinical (histocompatibility and immunogenetics). Dictation errors may occur despite best attempts at proofreading.     Claude Manges, PA-C 11/08/18 0915    Lorre Nick, MD 11/14/18 1235

## 2018-11-08 NOTE — ED Notes (Signed)
Asked for urine  

## 2018-11-08 NOTE — Discharge Instructions (Addendum)
You can take Tylenol or Ibuprofen as directed for pain. You can alternate Tylenol and Ibuprofen every 4 hours. If you take Tylenol at 1pm, then you can take Ibuprofen at 5pm. Then you can take Tylenol again at 9pm.   Take pain medications as directed for break through pain. Do not drive or operate machinery while taking this medication.   Take zofran as directed.   Follow-up with the referred Urology doctor.   Return to the Emergency Department for any worsening pain, persistent vomiting, difficulty breathing or any other worsening or concerning symptoms.

## 2018-11-08 NOTE — ED Notes (Signed)
Patient transported to CT 

## 2018-11-08 NOTE — ED Provider Notes (Signed)
[facility] Provider Note   CSN: [id number] Arrival date & time: 11/08/18  0430    History   Chief Complaint Chief Complaint  Patient presents with  . Abdominal Pain    HPI Rebekah Fields is a 28 y.o. female possible history of PCOS, kidney stones who presents for evaluation of left lower abdominal pain that started this afternoon at about 3:30 PM.  She states that initially, the pain began very mildly and then progressively worsened.  She states that throughout the night, the pain got more severe and she had associated episodes of nonbloody, nonbilious vomiting.  Patient states that she has not taken any medications for the pain.  Patient states she has a history of kidney stones and her last one was about 2 years ago.  She states that this feels similar to her previous episodes of kidney stones.  She reports some dysuria but has not noted any hematuria.  She has not noted any fevers.  She was fine prior to onset of symptoms.  She denies any chest pain, difficulty breathing.  Patient denies any vaginal bleeding, vaginal discharge.  She reports she is not currently sexually active.     The history is provided by the patient.    Past Medical History:  Diagnosis Date  . Kidney stones   . PCOS (polycystic ovarian syndrome)     There are no active problems to display for this patient.   Past Surgical History:  Procedure Laterality Date  . APPENDECTOMY       OB History   No obstetric history on file.      Home Medications    Prior to Admission medications   Medication Sig Start Date End Date Taking? Authorizing Provider  HYDROcodone-acetaminophen (NORCO/VICODIN) 5-325 MG tablet Take 2 tablets by mouth every 4 (four) hours as needed for up to 3 days. 11/08/18 11/11/18  Claude Manges, PA-C  hydrOXYzine (ATARAX/VISTARIL) 25 MG tablet Take 1 tablet (25 mg total) by mouth every 6 (six) hours as needed for anxiety. 09/19/17   Wallis Bamberg, PA-C   ibuprofen (ADVIL,MOTRIN) 200 MG tablet Take 600 mg by mouth every 6 (six) hours as needed (For pain.).    [provider]  ondansetron (ZOFRAN) 4 MG tablet Take 1 tablet (4 mg total) by mouth every 6 (six) hours for 5 days. 11/08/18 11/13/18  Claude Manges, PA-C  tamsulosin (FLOMAX) 0.4 MG CAPS capsule Take 1 capsule (0.4 mg total) by mouth daily for 7 days. 11/08/18 11/15/18  Claude Manges, PA-C    Family History History reviewed. No pertinent family history.  Social History Social History   Tobacco Use  . Smoking status: Never Smoker  . Smokeless tobacco: Never Used  Substance Use Topics  . Alcohol use: No    Frequency: Never  . Drug use: No     Allergies   Sulfa antibiotics; Lactose intolerance (gi); and Nickel   Review of Systems Review of Systems  Constitutional: Negative for fever.  Respiratory: Negative for cough and shortness of breath.   Cardiovascular: Negative for chest pain.  Gastrointestinal: Positive for abdominal pain, nausea and vomiting. Negative for diarrhea.  Genitourinary: Positive for dysuria. Negative for hematuria.  Musculoskeletal: Negative for back pain and neck pain.  Skin: Negative for rash.  Neurological: Negative for dizziness, weakness, numbness and headaches.  All other systems reviewed and are negative.    Physical Exam Updated Vital Signs BP 134/88   Pulse 90   Temp 98.1 F (36.7 C) (  Oral)   Resp 16   Ht  (1.6 m)   Wt 85.3 kg   LMP 10/25/2018   SpO2 100%   BMI 33.30 kg/m   Physical Exam Vitals signs and nursing note reviewed.  Constitutional:      Appearance: Normal appearance. She is well-developed.     Comments: Appears uncomfortable but NAD  HENT:     Head: Normocephalic and atraumatic.  Eyes:     General: Lids are normal.     Conjunctiva/sclera: Conjunctivae normal.     Pupils: Pupils are equal, round, and reactive to light.  Neck:     Musculoskeletal: Full passive range of motion without pain.   Cardiovascular:     Rate and Rhythm: Normal rate and regular rhythm.     Pulses: Normal pulses.     Heart sounds: Normal heart sounds. No murmur. No friction rub. No gallop.   Pulmonary:     Effort: Pulmonary effort is normal.     Breath sounds: Normal breath sounds.     Comments: Lungs clear to auscultation bilaterally.  Symmetric chest rise.  No wheezing, rales, rhonchi. Abdominal:     Palpations: Abdomen is soft. Abdomen is not rigid.     Tenderness: There is abdominal tenderness in the suprapubic area and left lower quadrant. There is no guarding.     Comments: Abdomen is soft, nondistended.  Tenderness palpation noted left lower quadrant suprapubic region.  No rigidity, guarding.  No CVA tenderness noted bilaterally.  Musculoskeletal: Normal range of motion.  Skin:    General: Skin is warm and dry.     Capillary Refill: Capillary refill takes less than 2 seconds.  Neurological:     Mental Status: She is alert and oriented to person, place, and time.  Psychiatric:        Speech: Speech normal.      ED Treatments / Results  Labs (all labs ordered are listed, but only abnormal results are displayed) Labs Reviewed  COMPREHENSIVE METABOLIC PANEL - Abnormal; Notable for the following components:      Result Value   Potassium 3.2 (*)    CO2 21 (*)    Glucose, Bld 123 (*)    Total Protein 8.4 (*)    All other components within normal limits  URINALYSIS, ROUTINE W REFLEX MICROSCOPIC - Abnormal; Notable for the following components:   APPearance HAZY (*)    Hgb urine dipstick LARGE (*)    Ketones, ur 80 (*)    Leukocytes,Ua LARGE (*)    Bacteria, UA RARE (*)    All other components within normal limits  URINE CULTURE  LIPASE, BLOOD  CBC  I-STAT BETA HCG BLOOD, ED ([facility], [facility], [facility] ONLY)    EKG None  Radiology Ct Renal Stone Study  Result Date: 11/08/2018 CLINICAL DATA:  28 year old female with left lower quadrant abdominal pain for the past 1 hour. EXAM: CT ABDOMEN AND  PELVIS WITHOUT CONTRAST TECHNIQUE: Multidetector CT imaging of the abdomen and pelvis was performed following the standard protocol without IV contrast. COMPARISON:  Abdominal radiograph 08/04/2017 FINDINGS: Lower chest: The lung bases are clear. Visualized cardiac structures are within normal limits for size. No pericardial effusion. Unremarkable visualized distal thoracic esophagus. Hepatobiliary: Low attenuation of the hepatic parenchyma with mild sparing around the gallbladder fossa consistent with hepatic steatosis. No evidence of cholelithiasis or biliary ductal dilatation. Pancreas: Unremarkable. No pancreatic ductal dilatation or surrounding inflammatory changes. Spleen: Normal in size without focal abnormality. Adrenals/Urinary Tract: Normal appearance of  the adrenal glands. Numerous kidney stones bilaterally. At least 7 small stones are present within the right renal collecting system the largest measuring 3 mm. There are a few punctate stones in the left lower pole collecting system. However, there is mild left hydronephrosis, renal edema and perinephric stranding along with mild ureterectasis secondary to a 4 mm stone at the left UVJ. The bladder is unremarkable. The right ureter is unremarkable. Stomach/Bowel: No evidence of obstruction or focal bowel wall thickening. The appendix is surgically absent. The terminal ileum is unremarkable. Vascular/Lymphatic: Limited evaluation in the absence of intravenous contrast. No evidence of aneurysm, atherosclerotic plaque or suspicious lymphadenopathy. Reproductive: Uterus and bilateral adnexa are unremarkable. Other: No abdominal wall hernia or abnormality. No abdominopelvic ascites. Musculoskeletal: No acute fracture or aggressive appearing lytic or blastic osseous lesion. IMPRESSION: 1. At least partially obstructing 4 mm stone in the left UVJ resulting in mild left hydronephrosis, renal edema and perinephric stranding. 2. Additional nonobstructing bilateral  nephrolithiasis. 3. Hepatic steatosis. 4. Surgical changes of prior appendectomy. Electronically Signed   By: Malachy MoanHeath  McCullough M.D.   On: 11/08/2018 07:45    Procedures Procedures (including critical care time)  Medications Ordered in ED Medications  sodium chloride flush (NS) 0.9 % injection 3 mL (3 mLs Intravenous Given 11/08/18 0545)  ondansetron (ZOFRAN) injection 4 mg (4 mg Intravenous Given 11/08/18 0545)  morphine 4 MG/ML injection 4 mg (4 mg Intravenous Given 11/08/18 0545)  ketorolac (TORADOL) 30 MG/ML injection 30 mg (30 mg Intravenous Given 11/08/18 0703)     Initial Impression / Assessment and Plan / ED Course  I have reviewed the triage vital signs and the nursing notes.  Pertinent labs & imaging results that were available during my care of the patient were reviewed by me and considered in my medical decision making (see chart for details).        28 year old female who presents for evaluation of left lower quadrant abdominal pain.  Reports that began mildly at about 3 this afternoon and progressively worsened.  Associated with vomiting and dysuria.  No fevers, hematuria.  On initial ED arrival, she is febrile but is slightly tachycardic.  Likely secondary to pain.  Vitals otherwise stable. Consider kidney stone vs UTI. History/physical exam is not concerning for appendicitis, diverticulitis, PID, ovarian etiology. Plan to check labs, urine.   CMP shows potassium of 3.2.  Bicarb is 21.  BUN and creatinine within normal limits.  CBC without any significant leukocytosis or anemia.  Lipase unremarkable.   Patient signed out to Claude MangesJohana Soto, PA-C with UA and CT renal study pending. Please see her note for further ED course.    Portions of this note were generated with Scientist, clinical (histocompatibility and immunogenetics)Dragon dictation software. Dictation errors may occur despite best attempts at proofreading.   Final Clinical Impressions(s) / ED Diagnoses   Final diagnoses:  Kidney stone    ED Discharge Orders          Ordered    ondansetron (ZOFRAN) 4 MG tablet  Every 6 hours     11/08/18 0911    HYDROcodone-acetaminophen (NORCO/VICODIN) 5-325 MG tablet  Every 4 hours PRN     11/08/18 0913    tamsulosin (FLOMAX) 0.4 MG CAPS capsule  Daily     11/08/18 0913           Maxwell CaulLayden,  A, PA-C 11/08/18 2208    Nira Connardama, Pedro Eduardo, MD 11/09/18 [phone]

## 2018-11-08 NOTE — ED Notes (Signed)
Patient ambulated to restroom in attempt to provide urine specimen.

## 2018-11-08 NOTE — ED Triage Notes (Signed)
Patient is complaining of left lower abdominal pain that started an hour ago. Patient states she thinks she have kidney stones. Patient is not having any back pain.

## 2018-11-09 LAB — URINE CULTURE: Culture: NO GROWTH

## 2019-08-09 ENCOUNTER — Ambulatory Visit (INDEPENDENT_AMBULATORY_CARE_PROVIDER_SITE_OTHER): Payer: Self-pay | Admitting: Otolaryngology

## 2019-08-09 ENCOUNTER — Encounter (INDEPENDENT_AMBULATORY_CARE_PROVIDER_SITE_OTHER): Payer: Self-pay | Admitting: Otolaryngology

## 2019-08-09 ENCOUNTER — Other Ambulatory Visit: Payer: Self-pay

## 2019-08-09 VITALS — Temp 97.9°F

## 2019-08-09 DIAGNOSIS — J310 Chronic rhinitis: Secondary | ICD-10-CM

## 2019-08-09 DIAGNOSIS — T485X5A Adverse effect of other anti-common-cold drugs, initial encounter: Secondary | ICD-10-CM

## 2019-08-09 DIAGNOSIS — H6123 Impacted cerumen, bilateral: Secondary | ICD-10-CM

## 2019-08-09 NOTE — Progress Notes (Signed)
HPI: Rebekah Fields is a 29 y.o. female who returns today for evaluation of chronic nasal sinus issues.  She always has trouble breathing through her nose.  She has been using decongestant spray on a regular basis.  He alternates from side to side as far as trouble breathing from her nose.  She also describes a lot of pressure in the sinuses and pressure in the face.  She has had no previous x-rays.  She also has a history of wax problems..  Past Medical History:  Diagnosis Date  . Kidney stones   . PCOS (polycystic ovarian syndrome)    Past Surgical History:  Procedure Laterality Date  . APPENDECTOMY     Social History   Socioeconomic History  . Marital status: Single    Spouse name: Not on file  . Number of children: Not on file  . Years of education: Not on file  . Highest education level: Not on file  Occupational History  . Not on file  Tobacco Use  . Smoking status: Never Smoker  . Smokeless tobacco: Never Used  Substance and Sexual Activity  . Alcohol use: No  . Drug use: No  . Sexual activity: Not on file  Other Topics Concern  . Not on file  Social History Narrative  . Not on file   Social Determinants of Health   Financial Resource Strain:   . Difficulty of Paying Living Expenses: Not on file  Food Insecurity:   . Worried About Programme researcher, broadcasting/film/video in the Last Year: Not on file  . Ran Out of Food in the Last Year: Not on file  Transportation Needs:   . Lack of Transportation (Medical): Not on file  . Lack of Transportation (Non-Medical): Not on file  Physical Activity:   . Days of Exercise per Week: Not on file  . Minutes of Exercise per Session: Not on file  Stress:   . Feeling of Stress : Not on file  Social Connections:   . Frequency of Communication with Friends and Family: Not on file  . Frequency of Social Gatherings with Friends and Family: Not on file  . Attends Religious Services: Not on file  . Active Member of Clubs or Organizations: Not on  file  . Attends Banker Meetings: Not on file  . Marital Status: Not on file   No family history on file. Allergies  Allergen Reactions  . Sulfa Antibiotics Swelling    Other reaction(s): SWELLING/EDEMA  . Lactose Intolerance (Gi) Other (See Comments)    diarrhea  . Nickel Itching   Prior to Admission medications   Medication Sig Start Date End Date Taking? Authorizing Provider  hydrOXYzine (ATARAX/VISTARIL) 25 MG tablet Take 1 tablet (25 mg total) by mouth every 6 (six) hours as needed for anxiety. 09/19/17  Yes Wallis Bamberg, PA-C  ibuprofen (ADVIL,MOTRIN) 200 MG tablet Take 600 mg by mouth every 6 (six) hours as needed (For pain.).   Yes [provider]     Positive ROS: Otherwise negative  All other systems have been reviewed and were otherwise negative with the exception of those mentioned in the HPI and as above.  Physical Exam: Constitutional: Alert, well-appearing, no acute distress Ears: External ears without lesions or tenderness.  She has large amount of wax in both ears that is obstructing her ear canals.  This was cleaned with suction and irrigation.  TMs were clear bilaterally. Nasal: External nose without lesions. Septum with mild deformity but significant mucosal  swelling especially on the right side which is completely obstructed secondary to the mucosal swelling..  No obvious mucopurulent discharge noted. Oral: Lips and gums without lesions. Tongue and palate mucosa without lesions. Posterior oropharynx clear. Neck: No palpable adenopathy or masses Respiratory: Breathing comfortably  Skin: No facial/neck lesions or rash noted.  Cerumen impaction removal  Date/Time: 08/09/2019 5:02 PM Performed by: Rozetta Nunnery, MD Authorized by: Rozetta Nunnery, MD   Consent:    Consent obtained:  Verbal   Consent given by:  Patient   Risks discussed:  Pain and bleeding Procedure details:    Location:  L ear and R ear   Procedure type:  irrigation and suction   Post-procedure details:    Inspection:  TM intact and canal normal   Hearing quality:  Improved   Patient tolerance of procedure:  Tolerated well, no immediate complications Comments:     TMs clear bilaterally.    Assessment: Bilateral cerumen impactions. Chronic rhinitis Rhinitis medicamentosa Questionable sinus disease.  Plan: Recommended regular use of Nasacort 2 sprays each nostril at night and prescribed this.  Recommend use of saline rinses during the day and try to wean off of the decongestant spray. Also suggested use of antihistamines as needed. If congestion symptoms persist following 1 month of regular use of nasal steroid spray she will call us back to schedule CT scan of the sinuses to rule out any obvious sinus disease and to consider possible surgical intervention.   Radene Journey, MD

## 2019-09-28 ENCOUNTER — Ambulatory Visit: Payer: 59 | Attending: Internal Medicine

## 2019-09-28 DIAGNOSIS — Z23 Encounter for immunization: Secondary | ICD-10-CM

## 2019-09-28 NOTE — Progress Notes (Signed)
   Covid-19 Vaccination Clinic  Name:  Rebekah Fields    MRN: [id number] DOB: [birth year]  09/28/2019  [name] was observed post Covid-19 immunization for 15 minutes without incident. She was provided with Vaccine Information Sheet and instruction to access the V-Safe system.   [name] was instructed to call 911 with any severe reactions post vaccine: Marland Kitchen Difficulty breathing  . Swelling of face and throat  . A fast heartbeat  . A bad rash all over body  . Dizziness and weakness   Immunizations Administered    Name Date Dose VIS Date Route   Pfizer COVID-19 Vaccine 09/28/2019 12:34 PM 0.3 mL 06/15/2019 Intramuscular   Manufacturer: [employer], [employer]   Lot: [id number]   NDC: [address]-1

## 2019-10-23 ENCOUNTER — Ambulatory Visit: Payer: 59 | Attending: Internal Medicine

## 2019-10-23 DIAGNOSIS — Z23 Encounter for immunization: Secondary | ICD-10-CM

## 2019-10-23 NOTE — Progress Notes (Signed)
   Covid-19 Vaccination Clinic  Name:  Rebekah Fields    MRN: [id number] DOB: 04-24-1991  10/23/2019  [name] was observed post Covid-19 immunization for 15 minutes without incident. She was provided with Vaccine Information Sheet and instruction to access the V-Safe system.   [name] was instructed to call 911 with any severe reactions post vaccine: Marland Kitchen Difficulty breathing  . Swelling of face and throat  . A fast heartbeat  . A bad rash all over body  . Dizziness and weakness   Immunizations Administered    Name Date Dose VIS Date Route   Pfizer COVID-19 Vaccine 10/23/2019 12:15 PM 0.3 mL 08/29/2018 Intramuscular   Manufacturer: [employer], [employer]   Lot: [id number]   NDC: [address]-7

## 2020-05-19 ENCOUNTER — Ambulatory Visit: Payer: 59 | Attending: Family

## 2020-05-19 DIAGNOSIS — Z23 Encounter for immunization: Secondary | ICD-10-CM

## 2020-08-19 NOTE — Progress Notes (Signed)
   Covid-19 Vaccination Clinic  Name:  Rebekah Fields    MRN: [id number] DOB: 1990/08/23  08/19/2020  [name] was observed post Covid-19 immunization for 15 minutes without incident. She was provided with Vaccine Information Sheet and instruction to access the V-Safe system.   [name] was instructed to call 911 with any severe reactions post vaccine: Marland Kitchen Difficulty breathing  . Swelling of face and throat  . A fast heartbeat  . A bad rash all over body  . Dizziness and weakness   Immunizations Administered    Name Date Dose VIS Date Route   Moderna Covid-19 Booster Vaccine 05/19/2020  1:30 AM 0.25 mL 04/23/2020 Intramuscular   Manufacturer: Moderna   Lot: [id number]   NDC: 25427-062-37

## 2020-09-17 ENCOUNTER — Emergency Department (HOSPITAL_COMMUNITY)
Admission: EM | Admit: 2020-09-17 | Discharge: 2020-09-18 | Disposition: A | Discharge status: Home / Self Care | Payer: 59 | Attending: Emergency Medicine | Admitting: Emergency Medicine

## 2020-09-17 ENCOUNTER — Encounter (HOSPITAL_COMMUNITY): Payer: Self-pay | Admitting: Emergency Medicine

## 2020-09-17 ENCOUNTER — Other Ambulatory Visit: Payer: Self-pay

## 2020-09-17 DIAGNOSIS — G4700 Insomnia, unspecified: Secondary | ICD-10-CM

## 2020-09-17 DIAGNOSIS — F419 Anxiety disorder, unspecified: Secondary | ICD-10-CM | POA: Insufficient documentation

## 2020-09-17 NOTE — ED Triage Notes (Signed)
Pt reports an increase in anxiety the last few days. States that she has been having difficulty sleeping.

## 2020-09-18 MED ORDER — HYDROXYZINE HCL 25 MG PO TABS: 25.0000 mg | ORAL_TABLET | Freq: Four times a day (QID) | ORAL | 0 refills | Status: DC | PRN

## 2020-09-18 MED ORDER — HYDROXYZINE HCL 25 MG PO TABS
25.0000 mg | ORAL_TABLET | Freq: Once | ORAL | Status: AC
  Administered 2020-09-18: 25 mg via ORAL
  Filled 2020-09-18: qty 1

## 2020-09-18 NOTE — ED Provider Notes (Signed)
[facility] EMERGENCY DEPARTMENT Provider Note   CSN: [id number] Arrival date & time: 09/17/20  2230     History Chief Complaint  Patient presents with  . Anxiety    Ma Munoz is a 30 y.o. female.  HPI     This a 30 year old female who presents with anxiety and insomnia.  Patient reports increased stressors at home and at work.  Over the last several days has had difficulty sleeping at night because "I will get a thought and it will make my mind race."  She reports panic attacks.  She has a history of similar symptoms in the past but is not currently on any medications.  It is mostly just affecting her sleep and is worse at night.  She does have a appointment with a psychiatrist scheduled.  She has no thoughts of self-harm or suicidal ideation.  She has no physical complaints at this time but she states she did vomit yesterday during 1 of these episodes.  She reports that she does still have interest in extracurricular activities.  Past Medical History:  Diagnosis Date  . Kidney stones   . PCOS (polycystic ovarian syndrome)     There are no problems to display for this patient.   Past Surgical History:  Procedure Laterality Date  . APPENDECTOMY       OB History   No obstetric history on file.     No family history on file.  Social History   Tobacco Use  . Smoking status: Never Smoker  . Smokeless tobacco: Never Used  Vaping Use  . Vaping Use: Never used  Substance Use Topics  . Alcohol use: No  . Drug use: No    Home Medications Prior to Admission medications   Medication Sig Start Date End Date Taking? Authorizing Provider  hydrOXYzine (ATARAX/VISTARIL) 25 MG tablet Take 1 tablet (25 mg total) by mouth every 6 (six) hours as needed for anxiety. 09/18/20   Cherita Hebel, Mayer Masker, MD  ibuprofen (ADVIL,MOTRIN) 200 MG tablet Take 600 mg by mouth every 6 (six) hours as needed (For pain.).    [provider]    Allergies    Sulfa  antibiotics, Lactose intolerance (gi), and Nickel  Review of Systems   Review of Systems  Constitutional: Negative for fever.  Respiratory: Negative for shortness of breath.   Cardiovascular: Negative for chest pain.  Gastrointestinal: Positive for nausea and vomiting. Negative for abdominal pain.  Psychiatric/Behavioral: Positive for sleep disturbance. The patient is nervous/anxious.   All other systems reviewed and are negative.   Physical Exam Updated Vital Signs BP (!) 147/91 (BP Location: Left Arm)   Pulse 90   Temp 98.4 F (36.9 C) (Oral)   Resp 18   SpO2 96%   Physical Exam Vitals and nursing note reviewed.  Constitutional:      Appearance: She is well-developed. She is obese. She is not ill-appearing.  HENT:     Head: Normocephalic and atraumatic.     Mouth/Throat:     Mouth: Mucous membranes are moist.  Eyes:     Pupils: Pupils are equal, round, and reactive to light.  Cardiovascular:     Rate and Rhythm: Normal rate and regular rhythm.     Heart sounds: Normal heart sounds.  Pulmonary:     Effort: Pulmonary effort is normal. No respiratory distress.     Breath sounds: No wheezing.  Abdominal:     Palpations: Abdomen is soft.  Musculoskeletal:  Cervical back: Neck supple.  Skin:    General: Skin is warm and dry.  Neurological:     Mental Status: She is alert and oriented to person, place, and time.  Psychiatric:        Mood and Affect: Mood normal.     Comments: Cooperative, calm     ED Results / Procedures / Treatments   Labs (all labs ordered are listed, but only abnormal results are displayed) Labs Reviewed - No data to display  EKG None  Radiology No results found.  Procedures Procedures   Medications Ordered in ED Medications  hydrOXYzine (ATARAX/VISTARIL) tablet 25 mg (has no administration in time range)    ED Course  I have reviewed the triage vital signs and the nursing notes.  Pertinent labs & imaging results that were  available during my care of the patient were reviewed by me and considered in my medical decision making (see chart for details).    MDM Rules/Calculators/A&P                          Patient presents with complaints of anxiety and panic attack.  She reports that it is affecting her sleep.  She is overall nontoxic and vital signs are reassuring.  She is calm and cooperative on exam.  She has no physical complaints at this time.  She denies SI or HI.  She does not have any other major symptoms of depression but does report increased stressors at home.  Will trial Vistaril as she has been on this before based on chart review.  Recommend close follow-up with psychiatrist as scheduled.  After history, exam, and medical workup I feel the patient has been appropriately medically screened and is safe for discharge home. Pertinent diagnoses were discussed with the patient. Patient was given return precautions.  Final Clinical Impression(s) / ED Diagnoses Final diagnoses:  Anxiety  Insomnia, unspecified type    Rx / DC Orders ED Discharge Orders         Ordered    hydrOXYzine (ATARAX/VISTARIL) 25 MG tablet  Every 6 hours PRN        09/18/20 0006           Shon Baton, MD 09/18/20 0015

## 2020-09-18 NOTE — Discharge Instructions (Addendum)
Follow-up with your psychiatry appointment as planned.  Seek medical attention if you develop suicidal ideation or thoughts of self-harm.

## 2021-01-14 ENCOUNTER — Other Ambulatory Visit (HOSPITAL_COMMUNITY): Payer: Self-pay

## 2021-12-24 ENCOUNTER — Ambulatory Visit
Admission: RE | Admit: 2021-12-24 | Discharge: 2021-12-24 | Disposition: A | Discharge status: Home / Self Care | Payer: 59 | Source: Ambulatory Visit | Attending: Nurse Practitioner | Admitting: Nurse Practitioner

## 2021-12-24 ENCOUNTER — Ambulatory Visit (INDEPENDENT_AMBULATORY_CARE_PROVIDER_SITE_OTHER): Payer: 59 | Admitting: Nurse Practitioner

## 2021-12-24 ENCOUNTER — Encounter: Payer: Self-pay | Admitting: Nurse Practitioner

## 2021-12-24 VITALS — BP 108/76 | HR 85 | Temp 97.9°F | Ht 63.0 in | Wt 191.8 lb

## 2021-12-24 DIAGNOSIS — M25572 Pain in left ankle and joints of left foot: Secondary | ICD-10-CM

## 2021-12-24 DIAGNOSIS — Z7689 Persons encountering health services in other specified circumstances: Secondary | ICD-10-CM | POA: Diagnosis not present

## 2021-12-24 DIAGNOSIS — M25472 Effusion, left ankle: Secondary | ICD-10-CM | POA: Insufficient documentation

## 2021-12-24 DIAGNOSIS — E282 Polycystic ovarian syndrome: Secondary | ICD-10-CM | POA: Diagnosis not present

## 2021-12-24 DIAGNOSIS — Z87442 Personal history of urinary calculi: Secondary | ICD-10-CM | POA: Diagnosis not present

## 2021-12-24 NOTE — Progress Notes (Signed)
New Patient Office Visit  Subjective    Patient ID: Rebekah Fields, female    DOB: September 07, 1990  Age: 31 y.o. MRN: [id number]  CC:  Chief Complaint  Patient presents with   New Patient (Initial Visit)    HPI Rebekah Fields presents to establish care -Thursday, she noted some pain in left foot when she was walking. She states the pain wasn't severe, just annoying.  -over the weekend, she developed significant swelling on inner aspect of left ankle. She states that ankle was nearly the size of a golf ball.  -has been icing the ankle every night since the pain started. Has not really helped the swelling.  -bruising present now on inner left ankle.  -she denies injury or trauma to the left foot or ankle.  -reports history of flat feet.   -has not had PCP in several years.  -last saw GYN about 2 years ago.  -has seen e Med provider. Has been prescribed norethindrone 0.35 mg daily. This has helped with her diagnosis of PCOS. She states that she does not plan to go back to the GYN provider she saw in the past.  -history of kidney stones - started having them back in 2012. She has had so many she has lost count. Most recent stone was last week. Has seen urologist in the past without any significant results or changes. Was instructed to drink more water.   Outpatient Encounter Medications as of 12/24/2021  Medication Sig   norethindrone (MICRONOR) 0.35 MG tablet Take 1 tablet by mouth daily.   [DISCONTINUED] hydrOXYzine (ATARAX/VISTARIL) 25 MG tablet Take 1 tablet (25 mg total) by mouth every 6 (six) hours as needed for anxiety.   [DISCONTINUED] ibuprofen (ADVIL,MOTRIN) 200 MG tablet Take 600 mg by mouth every 6 (six) hours as needed (For pain.).   No facility-administered encounter medications on file as of 12/24/2021.    Past Medical History:  Diagnosis Date   Allergy    Anxiety    Depression    Kidney stones    PCOS (polycystic ovarian syndrome)     Past Surgical History:   Procedure Laterality Date   APPENDECTOMY      Family History  Problem Relation Age of Onset   High blood pressure Mother    Hypertension Mother    Miscarriages / [place] Mother    Diabetes Father    Diabetes Maternal Grandmother    Cancer Maternal Grandfather    Diabetes Maternal Grandfather    Cancer Maternal Aunt    Diabetes Maternal Aunt    Miscarriages / Stillbirths Maternal Aunt     Social History   Socioeconomic History   Marital status: Single    Spouse name: Not on file   Number of children: Not on file   Years of education: Not on file   Highest education level: Not on file  Occupational History   Not on file  Tobacco Use   Smoking status: Never   Smokeless tobacco: Never  Vaping Use   Vaping Use: Never used  Substance and Sexual Activity   Alcohol use: No   Drug use: Never   Sexual activity: Yes    Birth control/protection: Pill  Other Topics Concern   Not on file  Social History Narrative   Not on file   Social Determinants of Health   Financial Resource Strain: Not on file  Food Insecurity: Not on file  Transportation Needs: Not on file  Physical Activity: Not on file  Stress: Not  on file  Social Connections: Not on file  Intimate Partner Violence: Not on file    Review of Systems  Constitutional:  Negative for chills, fever and malaise/fatigue.  HENT:  Negative for congestion, sinus pain and sore throat.   Eyes: Negative.   Respiratory:  Negative for cough, shortness of breath and wheezing.   Cardiovascular:  Negative for chest pain, palpitations and leg swelling.  Gastrointestinal:  Negative for constipation, diarrhea, nausea and vomiting.  Genitourinary: Negative.        Patient hs positive history of PCOS   Musculoskeletal:  Positive for myalgias.       Left ankle swelling.   Skin: Negative.   Neurological:  Negative for dizziness and headaches.  Endo/Heme/Allergies:  Does not bruise/bleed easily.  Psychiatric/Behavioral:   Negative for depression. The patient is not nervous/anxious.         Objective    Today's Vitals   12/24/21 1019  BP: 108/76  Pulse: 85  Temp: 97.9 F (36.6 C)  SpO2: 98%  Weight: 191 lb 12.8 oz (87 kg)  Height: 5\' 3"  (1.6 m)   Body mass index is 33.98 kg/m.   Physical Exam Vitals and nursing note reviewed.  Constitutional:      Appearance: Normal appearance. She is well-developed.  HENT:     Head: Normocephalic and atraumatic.     Nose: Nose normal.     Mouth/Throat:     Mouth: Mucous membranes are moist.     Pharynx: Oropharynx is clear.  Eyes:     Conjunctiva/sclera: Conjunctivae normal.     Pupils: Pupils are equal, round, and reactive to light.  Cardiovascular:     Rate and Rhythm: Normal rate and regular rhythm.     Pulses: Normal pulses.     Heart sounds: Normal heart sounds.  Pulmonary:     Effort: Pulmonary effort is normal.     Breath sounds: Normal breath sounds.  Abdominal:     Palpations: Abdomen is soft.  Musculoskeletal:        General: Normal range of motion.     Cervical back: Normal range of motion and neck supple.     Right ankle: Normal.     Left ankle: Swelling and ecchymosis present. Tenderness present over the medial malleolus. Normal range of motion.       Legs:  Lymphadenopathy:     Cervical: No cervical adenopathy.  Skin:    General: Skin is warm and dry.     Capillary Refill: Capillary refill takes less than 2 seconds.  Neurological:     General: No focal deficit present.     Mental Status: She is alert and oriented to person, place, and time.  Psychiatric:        Mood and Affect: Mood normal.        Behavior: Behavior normal.        Thought Content: Thought content normal.        Judgment: Judgment normal.      Assessment & Plan:  1. Acute left ankle pain Advised her to rest, ice, and elevate her left ankle when possible. Take tylenol and/or ibuprofen for pain and inflammation. May use ace bandage or ankle brace for  support. Will get x-ray of left ankle for further evaluation.  - DG Ankle 2 Views Left; Future  2. PCOS (polycystic ovarian syndrome) Patient has positive history of PCOS. Currently on norethindrone. Doing well.   3. History of kidney stones She believe renal stones have been  calcium oxylate. Will refer to new urologist as needed   4. Encounter to establish care Appointment today to establish new primary care provider     Problem List Items Addressed This Visit       Endocrine   PCOS (polycystic ovarian syndrome)     Other   Acute left ankle pain - Primary   Relevant Orders   DG Ankle 2 Views Left   History of kidney stones   Other Visit Diagnoses     Encounter to establish care           Return in about 2 months (around 02/23/2022) for health maintenance exam, with pap, FBW at time of visit.   Carlean Jews, NP

## 2022-02-15 NOTE — Progress Notes (Signed)
Negative x-ray of left ankle

## 2022-02-23 NOTE — Progress Notes (Unsigned)
Complete physical exam   Patient: Seletha Zimmermann   DOB: [birth year]   31 y.o. Female  MRN: [id number] Visit Date: 02/24/2022    No chief complaint on file.  Subjective    Donnielle Addison is a 31 y.o. female who presents today for a complete physical exam.  She reports consuming a {diet types:17450} diet. {Exercise:19826} She generally feels {well/fairly well/poorly:18703}. She {does/does not:200015} have additional problems to discuss today.   HPI  Annual physical and pap smear  -history of PCOS -due to have routine, fasting labs   Past Medical History:  Diagnosis Date   Allergy    Anxiety    Depression    Kidney stones    PCOS (polycystic ovarian syndrome)    Past Surgical History:  Procedure Laterality Date   APPENDECTOMY     Social History   Socioeconomic History   Marital status: Single    Spouse name: Not on file   Number of children: Not on file   Years of education: Not on file   Highest education level: Not on file  Occupational History   Not on file  Tobacco Use   Smoking status: Never   Smokeless tobacco: Never  Vaping Use   Vaping Use: Never used  Substance and Sexual Activity   Alcohol use: No   Drug use: Never   Sexual activity: Yes    Birth control/protection: Pill  Other Topics Concern   Not on file  Social History Narrative   Not on file   Social Determinants of Health   Financial Resource Strain: Not on file  Food Insecurity: Not on file  Transportation Needs: Not on file  Physical Activity: Not on file  Stress: Not on file  Social Connections: Not on file  Intimate Partner Violence: Not on file   Family Status  Relation Name Status   Mother Mckyla Deckman (Not Specified)   Father Chaeli Judy (Not Specified)   MGM Margarette [place] (Not Specified)   MGF Loney Loh (Not Specified)   Mat Aunt Dionne Bucy (Not Specified)   Mat Aunt Ellin Goodie (Not Specified)   Family History  Problem Relation Age of Onset   High blood  pressure Mother    Hypertension Mother    Miscarriages / [place] Mother    Diabetes Father    Diabetes Maternal Grandmother    Cancer Maternal Grandfather    Diabetes Maternal Grandfather    Cancer Maternal Aunt    Diabetes Maternal Aunt    Miscarriages / Stillbirths Maternal Aunt    Allergies  Allergen Reactions   Sulfa Antibiotics Swelling and Shortness Of Breath    Other reaction(s): SWELLING/EDEMA Other reaction(s): SWELLING/EDEMA   Lactose Intolerance (Gi) Other (See Comments)    diarrhea   Nickel Itching    Patient Care Team: Carlean Jews, NP as PCP - General (Family Medicine)   Medications: Outpatient Medications Prior to Visit  Medication Sig   norethindrone (MICRONOR) 0.35 MG tablet Take 1 tablet by mouth daily.   No facility-administered medications prior to visit.    Review of Systems  {Labs (Optional):23779}   Objective    There were no vitals taken for this visit. BP Readings from Last 3 Encounters:  12/24/21 108/76  09/17/20 (!) 147/91  11/08/18 134/88    Wt Readings from Last 3 Encounters:  12/24/21 191 lb 12.8 oz (87 kg)  11/08/18 188 lb (85.3 kg)  11/26/16 188 lb (85.3 kg)     Physical Exam  ***  Last depression  screening scores    12/24/2021   10:27 AM  PHQ 2/9 Scores  PHQ - 2 Score 0  PHQ- 9 Score 3   Last fall risk screening     No data to display         Last Audit-C alcohol use screening     No data to display         A score of 3 or more in women, and 4 or more in men indicates increased risk for alcohol abuse, EXCEPT if all of the points are from question 1   No results found for any visits on 02/24/22.  Assessment & Plan    Routine Health Maintenance and Physical Exam  Exercise Activities and Dietary recommendations  Goals   None     Immunization History  Administered Date(s) Administered   Moderna SARS-COV2 Booster Vaccination 05/19/2020   PFIZER(Purple Top)SARS-COV-2 Vaccination 09/28/2019,  10/23/2019   PPD Test 07/28/2020    Health Maintenance  Topic Date Due   HIV Screening  Never done   Hepatitis C Screening  Never done   TETANUS/TDAP  Never done   PAP-Cervical Cytology Screening  Never done   PAP SMEAR-Modifier  Never done   COVID-19 Vaccine (3 - Pfizer series) 07/14/2020   INFLUENZA VACCINE  02/02/2022   HPV VACCINES  Aged Out    Discussed health benefits of physical activity, and encouraged her to engage in regular exercise appropriate for her age and condition.  Problem List Items Addressed This Visit   None    No follow-ups on file.        Carlean Jews, NP  [facility] at [facility] [phone] (phone) [phone] (fax)  [facility]

## 2022-02-24 ENCOUNTER — Encounter: Payer: Self-pay | Admitting: Nurse Practitioner

## 2022-02-24 ENCOUNTER — Other Ambulatory Visit (HOSPITAL_COMMUNITY)
Admission: RE | Admit: 2022-02-24 | Discharge: 2022-02-24 | Disposition: A | Discharge status: Home / Self Care | Payer: 59 | Source: Ambulatory Visit | Attending: Nurse Practitioner | Admitting: Nurse Practitioner

## 2022-02-24 ENCOUNTER — Ambulatory Visit (INDEPENDENT_AMBULATORY_CARE_PROVIDER_SITE_OTHER): Payer: 59 | Admitting: Nurse Practitioner

## 2022-02-24 VITALS — BP 114/81 | HR 85 | Ht 63.0 in | Wt 194.1 lb

## 2022-02-24 DIAGNOSIS — Z6834 Body mass index (BMI) 34.0-34.9, adult: Secondary | ICD-10-CM | POA: Diagnosis not present

## 2022-02-24 DIAGNOSIS — R5383 Other fatigue: Secondary | ICD-10-CM | POA: Diagnosis not present

## 2022-02-24 DIAGNOSIS — Z01419 Encounter for gynecological examination (general) (routine) without abnormal findings: Secondary | ICD-10-CM

## 2022-02-24 DIAGNOSIS — E559 Vitamin D deficiency, unspecified: Secondary | ICD-10-CM | POA: Insufficient documentation

## 2022-02-24 DIAGNOSIS — E282 Polycystic ovarian syndrome: Secondary | ICD-10-CM | POA: Diagnosis not present

## 2022-02-24 DIAGNOSIS — Z23 Encounter for immunization: Secondary | ICD-10-CM

## 2022-02-25 LAB — LIPID PANEL
Chol/HDL Ratio: 3.8 ratio (ref 0.0–4.4)
Cholesterol, Total: 212 mg/dL — ABNORMAL HIGH (ref 100–199)
HDL: 56 mg/dL (ref >39)
LDL Chol Calc (NIH): 142 mg/dL — ABNORMAL HIGH (ref 0–99)
Triglycerides: 81 mg/dL (ref 0–149)
VLDL Cholesterol Cal: 14 mg/dL (ref 5–40)

## 2022-02-25 LAB — CBC
Hematocrit: 40.6 % (ref 34.0–46.6)
Hemoglobin: 13.4 g/dL (ref 11.1–15.9)
MCH: 27.6 pg (ref 26.6–33.0)
MCHC: 33 g/dL (ref 31.5–35.7)
MCV: 84 fL (ref 79–97)
Platelets: 401 10*3/uL (ref 150–450)
RBC: 4.85 x10E6/uL (ref 3.77–5.28)
RDW: 12.8 % (ref 11.7–15.4)
WBC: 6.8 10*3/uL (ref 3.4–10.8)

## 2022-02-25 LAB — COMPREHENSIVE METABOLIC PANEL
ALT: 16 IU/L (ref 0–32)
AST: 11 IU/L (ref 0–40)
Albumin/Globulin Ratio: 1.2 (ref 1.2–2.2)
Albumin: 4.6 g/dL (ref 4.0–5.0)
Alkaline Phosphatase: 68 IU/L (ref 44–121)
BUN/Creatinine Ratio: 15 (ref 9–23)
BUN: 11 mg/dL (ref 6–20)
Bilirubin Total: 0.5 mg/dL (ref 0.0–1.2)
CO2: 25 mmol/L (ref 20–29)
Calcium: 10 mg/dL (ref 8.7–10.2)
Chloride: 98 mmol/L (ref 96–106)
Creatinine, Ser: 0.75 mg/dL (ref 0.57–1.00)
Globulin, Total: 3.7 g/dL (ref 1.5–4.5)
Glucose: 97 mg/dL (ref 70–99)
Potassium: 4.5 mmol/L (ref 3.5–5.2)
Sodium: 135 mmol/L (ref 134–144)
Total Protein: 8.3 g/dL (ref 6.0–8.5)
eGFR: 110 mL/min/{1.73_m2} (ref >59)

## 2022-02-25 LAB — HEMOGLOBIN A1C
Est. average glucose Bld gHb Est-mCnc: 131 mg/dL
Hgb A1c MFr Bld: 6.2 % — ABNORMAL HIGH (ref 4.8–5.6)

## 2022-02-25 LAB — TSH+FREE T4
Free T4: 1.4 ng/dL (ref 0.82–1.77)
TSH: 1.02 u[IU]/mL (ref 0.450–4.500)

## 2022-02-25 LAB — VITAMIN D 25 HYDROXY (VIT D DEFICIENCY, FRACTURES): Vit D, 25-Hydroxy: 18.1 ng/mL — ABNORMAL LOW (ref 30.0–100.0)

## 2022-03-01 LAB — CYTOLOGY - PAP: Diagnosis: NEGATIVE

## 2022-04-19 ENCOUNTER — Other Ambulatory Visit: Payer: Self-pay | Admitting: Nurse Practitioner

## 2022-04-19 ENCOUNTER — Encounter: Payer: Self-pay | Admitting: Nurse Practitioner

## 2022-04-19 DIAGNOSIS — E559 Vitamin D deficiency, unspecified: Secondary | ICD-10-CM

## 2022-04-19 MED ORDER — ERGOCALCIFEROL 1.25 MG (50000 UT) PO CAPS: 50000.0000 [IU] | ORAL_CAPSULE | ORAL | 5 refills | Status: DC

## 2022-04-19 NOTE — Progress Notes (Signed)
Reviewed labs.  Vitamin d deficieccy. Started drisdol weekly for next 6 months.  Mild elevation of bad and total  cholesterol and blood sugars. I recommend you limit intake of fried and fatty foods. You should increase intake of lean proteins and green leafy vegetables. Adding exercise into daily routine will also be beneficial.  Other labs look good.

## 2022-09-22 ENCOUNTER — Ambulatory Visit: Payer: 59 | Admitting: Nurse Practitioner

## 2022-09-28 ENCOUNTER — Ambulatory Visit: Payer: 59 | Admitting: Nurse Practitioner

## 2022-10-03 ENCOUNTER — Other Ambulatory Visit: Payer: Self-pay | Admitting: Nurse Practitioner

## 2022-10-03 DIAGNOSIS — E559 Vitamin D deficiency, unspecified: Secondary | ICD-10-CM

## 2022-10-20 ENCOUNTER — Ambulatory Visit: Payer: 59 | Admitting: Nurse Practitioner

## 2022-10-21 ENCOUNTER — Encounter: Payer: Self-pay | Admitting: Nurse Practitioner

## 2022-10-21 ENCOUNTER — Ambulatory Visit (INDEPENDENT_AMBULATORY_CARE_PROVIDER_SITE_OTHER): Payer: 59 | Admitting: Nurse Practitioner

## 2022-10-21 VITALS — BP 130/94 | HR 93 | Ht 63.0 in | Wt 194.1 lb

## 2022-10-21 DIAGNOSIS — E282 Polycystic ovarian syndrome: Secondary | ICD-10-CM

## 2022-10-21 DIAGNOSIS — E559 Vitamin D deficiency, unspecified: Secondary | ICD-10-CM

## 2022-10-21 DIAGNOSIS — N911 Secondary amenorrhea: Secondary | ICD-10-CM

## 2022-10-21 MED ORDER — NORETHIN ACE-ETH ESTRAD-FE 1-20 MG-MCG PO TABS: 1.0000 | ORAL_TABLET | Freq: Every day | ORAL | 11 refills | Status: DC

## 2022-10-21 MED ORDER — ERGOCALCIFEROL 1.25 MG (50000 UT) PO CAPS: 50000.0000 [IU] | ORAL_CAPSULE | ORAL | 5 refills | Status: DC

## 2022-10-21 NOTE — Progress Notes (Signed)
Established patient visit   Patient: Rebekah Fields   DOB: May 27, 1991   32 y.o. Female  MRN: [id number] Visit Date: 10/21/2022   Chief Complaint  Patient presents with   Menstrual Problem   Subjective    HPI  Follow up  -history of PCOS and secondary amenorrhea  -currently on norethindrone  History of moderate vitamin D deficiency  -would like to have refill for this  -she has no new concerns or complaints today  -She denies chest pain, chest pressure, or shortness of breath. She denies headaches or visual disturbances. She denies abdominal pain, nausea, vomiting, or changes in bowel or bladder habits.       Medications: Outpatient Medications Prior to Visit  Medication Sig   [DISCONTINUED] ergocalciferol (DRISDOL) 1.25 MG (50000 UT) capsule Take 1 capsule (50,000 Units total) by mouth once a week.   [DISCONTINUED] norethindrone (MICRONOR) 0.35 MG tablet Take 1 tablet by mouth daily. (Patient not taking: Reported on 10/21/2022)   No facility-administered medications prior to visit.    Review of Systems See HPI    Last CBC Lab Results  Component Value Date   WBC 10.2 11/16/2022   HGB 13.7 11/16/2022   HCT 41.3 11/16/2022   MCV 84.8 11/16/2022   MCH 28.1 11/16/2022   RDW 12.7 11/16/2022   PLT 473 (H) 11/16/2022   Last metabolic panel Lab Results  Component Value Date   GLUCOSE 115 (H) 11/16/2022   NA 134 (L) 11/16/2022   K 3.6 11/16/2022   CL 102 11/16/2022   CO2 22 11/16/2022   BUN 11 11/16/2022   CREATININE 1.26 (H) 11/16/2022   EGFR 110 02/24/2022   CALCIUM 9.1 11/16/2022   PROT 8.8 (H) 11/16/2022   ALBUMIN 3.9 11/16/2022   LABGLOB 3.7 02/24/2022   AGRATIO 1.2 02/24/2022   BILITOT 0.6 11/16/2022   ALKPHOS 39 11/16/2022   AST 21 11/16/2022   ALT 31 11/16/2022   ANIONGAP 10 11/16/2022   Last lipids Lab Results  Component Value Date   CHOL 212 (H) 02/24/2022   HDL 56 02/24/2022   LDLCALC 142 (H) 02/24/2022   TRIG 81 02/24/2022   CHOLHDL 3.8  02/24/2022   Last hemoglobin A1c Lab Results  Component Value Date   HGBA1C 6.2 (H) 02/24/2022   Last thyroid functions Lab Results  Component Value Date   TSH 1.020 02/24/2022   Last vitamin D Lab Results  Component Value Date   VD25OH 18.1 (L) 02/24/2022       Objective     Today's Vitals   10/21/22 1557 10/21/22 1631  BP: (Abnormal) 148/95 (Abnormal) 130/94  Pulse: 93   SpO2: 98%   Weight: 194 lb 1.9 oz (88.1 kg)   Height: 5\' 3"  (1.6 m)    Body mass index is 34.39 kg/m.  BP Readings from Last 3 Encounters:  11/16/22 (Abnormal) 133/96  11/14/22 (Abnormal) 118/93  11/14/22 134/66    Wt Readings from Last 3 Encounters:  11/14/22 194 lb (88 kg)  10/21/22 194 lb 1.9 oz (88.1 kg)  02/24/22 194 lb 1.9 oz (88.1 kg)    Physical Exam Vitals and nursing note reviewed.  Constitutional:      Appearance: Normal appearance. She is well-developed.  HENT:     Head: Normocephalic and atraumatic.     Nose: Nose normal.     Mouth/Throat:     Mouth: Mucous membranes are moist.     Pharynx: Oropharynx is clear.  Eyes:     Extraocular Movements: Extraocular movements  intact.     Conjunctiva/sclera: Conjunctivae normal.     Pupils: Pupils are equal, round, and reactive to light.  Neck:     Vascular: No carotid bruit.  Cardiovascular:     Rate and Rhythm: Normal rate and regular rhythm.     Pulses: Normal pulses.     Heart sounds: Normal heart sounds.  Pulmonary:     Effort: Pulmonary effort is normal.     Breath sounds: Normal breath sounds.  Abdominal:     Palpations: Abdomen is soft.  Musculoskeletal:        General: Normal range of motion.     Cervical back: Normal range of motion and neck supple.  Lymphadenopathy:     Cervical: No cervical adenopathy.  Skin:    General: Skin is warm and dry.     Capillary Refill: Capillary refill takes less than 2 seconds.  Neurological:     General: No focal deficit present.     Mental Status: She is alert and oriented to  person, place, and time.  Psychiatric:        Mood and Affect: Mood normal.        Behavior: Behavior normal.        Thought Content: Thought content normal.        Judgment: Judgment normal.      Assessment & Plan    PCOS (polycystic ovarian syndrome) -     Norethin Ace-Eth Estrad-FE; Take 1 tablet by mouth daily. (Patient not taking: Reported on 11/16/2022)  Dispense: 28 tablet; Refill: 11  Secondary amenorrhea -     Norethin Ace-Eth Estrad-FE; Take 1 tablet by mouth daily. (Patient not taking: Reported on 11/16/2022)  Dispense: 28 tablet; Refill: 11  Vitamin D deficiency -     Ergocalciferol; Take 1 capsule (50,000 Units total) by mouth once a week. (Patient taking differently: Take 50,000 Units by mouth every Friday.)  Dispense: 4 capsule; Refill: 5     Return in about 4 months (around 02/20/2023) for health maintenance exam, FBW a week prior to visit with [facility].         Carlean Jews, NP  [facility] at [facility] [phone] (phone) [phone] (fax)  [facility]

## 2022-10-25 ENCOUNTER — Ambulatory Visit: Payer: 59 | Admitting: Nurse Practitioner

## 2022-10-30 IMAGING — CR DG ANKLE 2V *L*
2 series · 2 of 2 positions shown · non-contrast
Comparison: None Available.

CLINICAL DATA: Left ankle pain and swelling with walking. No
injury.

EXAM:
LEFT ANKLE - 2 VIEW

[x ankle ap left]
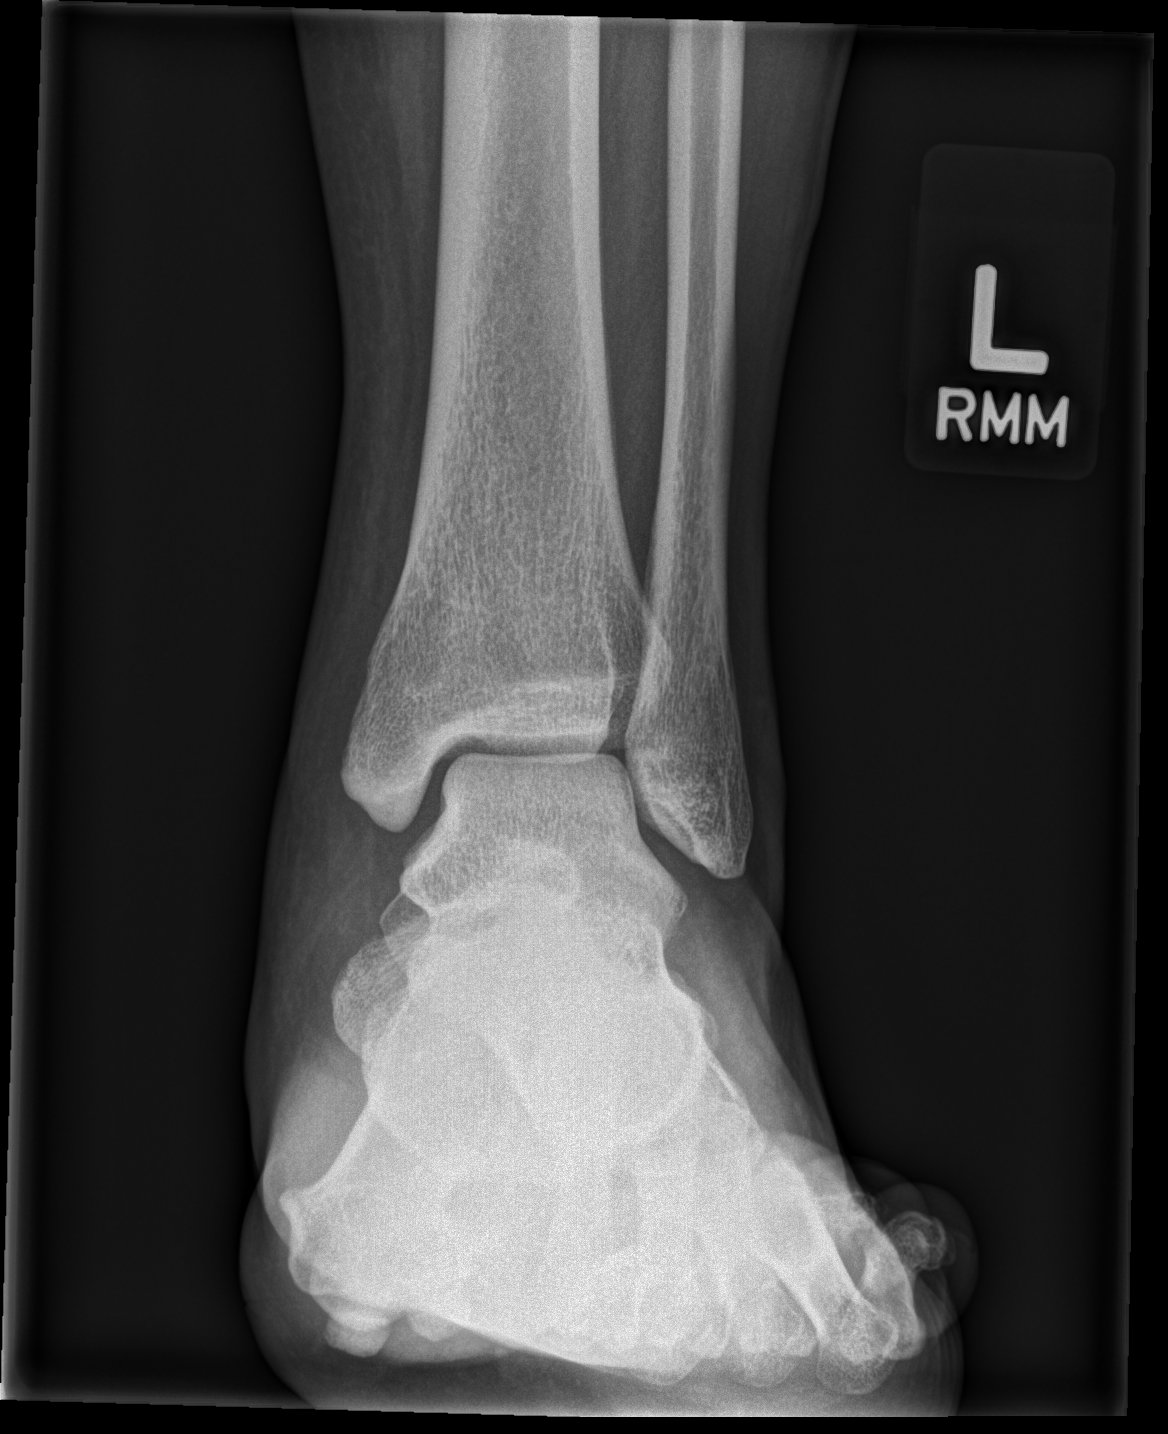

[x ankle lat left]
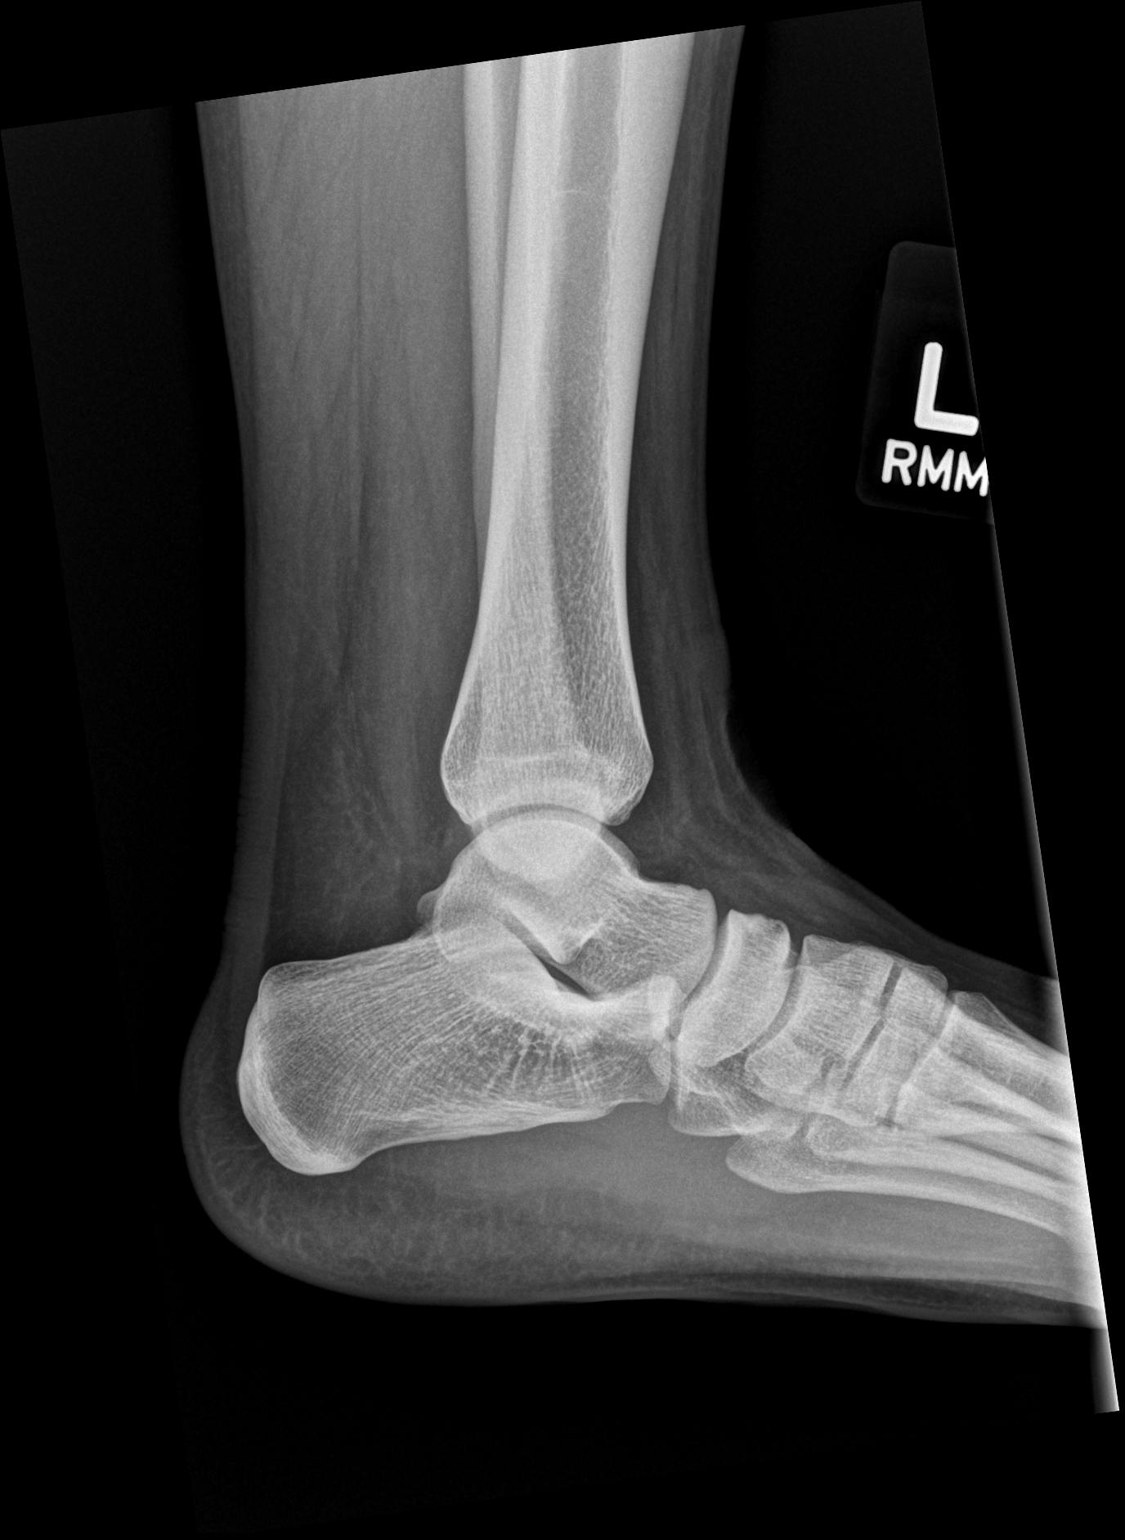

[2 of 2 positions shown; findings below may reference images not displayed]

FINDINGS: There is no evidence of fracture, dislocation, or joint effusion.
There is no evidence of arthropathy or other focal bone abnormality.
Soft tissues are unremarkable.
IMPRESSION: Negative.

## 2022-11-14 ENCOUNTER — Emergency Department (HOSPITAL_COMMUNITY)
Admission: EM | Admit: 2022-11-14 | Discharge: 2022-11-14 | Disposition: A | Discharge status: Home / Self Care | Payer: 59 | Attending: Emergency Medicine | Admitting: Emergency Medicine

## 2022-11-14 ENCOUNTER — Other Ambulatory Visit: Payer: Self-pay

## 2022-11-14 ENCOUNTER — Emergency Department (HOSPITAL_COMMUNITY): Payer: 59

## 2022-11-14 ENCOUNTER — Encounter (HOSPITAL_COMMUNITY): Payer: Self-pay | Admitting: Pharmacy Technician

## 2022-11-14 ENCOUNTER — Telehealth: Payer: Self-pay

## 2022-11-14 ENCOUNTER — Emergency Department (HOSPITAL_COMMUNITY)
Admission: EM | Admit: 2022-11-14 | Discharge: 2022-11-14 | Disposition: A | Discharge status: Home / Self Care | Payer: 59 | Source: Home / Self Care | Attending: Emergency Medicine | Admitting: Emergency Medicine

## 2022-11-14 DIAGNOSIS — N132 Hydronephrosis with renal and ureteral calculous obstruction: Secondary | ICD-10-CM | POA: Diagnosis not present

## 2022-11-14 DIAGNOSIS — N200 Calculus of kidney: Secondary | ICD-10-CM | POA: Insufficient documentation

## 2022-11-14 DIAGNOSIS — R109 Unspecified abdominal pain: Secondary | ICD-10-CM | POA: Diagnosis present

## 2022-11-14 LAB — COMPREHENSIVE METABOLIC PANEL
ALT: 20 U/L (ref 0–44)
AST: 19 U/L (ref 15–41)
Albumin: 3.8 g/dL (ref 3.5–5.0)
Alkaline Phosphatase: 45 U/L (ref 38–126)
Anion gap: 14 (ref 5–15)
BUN: 9 mg/dL (ref 6–20)
CO2: 18 mmol/L — ABNORMAL LOW (ref 22–32)
Calcium: 9.3 mg/dL (ref 8.9–10.3)
Chloride: 103 mmol/L (ref 98–111)
Creatinine, Ser: 0.99 mg/dL (ref 0.44–1.00)
GFR, Estimated: >60 mL/min (ref >60)
Glucose, Bld: 158 mg/dL — ABNORMAL HIGH (ref 70–99)
Potassium: 3.5 mmol/L (ref 3.5–5.1)
Sodium: 135 mmol/L (ref 135–145)
Total Bilirubin: 0.7 mg/dL (ref 0.3–1.2)
Total Protein: 8.5 g/dL — ABNORMAL HIGH (ref 6.5–8.1)

## 2022-11-14 LAB — URINALYSIS, ROUTINE W REFLEX MICROSCOPIC
Bilirubin Urine: NEGATIVE
Glucose, UA: NEGATIVE mg/dL
Ketones, ur: 20 mg/dL — AB
Leukocytes,Ua: NEGATIVE
Nitrite: NEGATIVE
Protein, ur: NEGATIVE mg/dL
Specific Gravity, Urine: 1.012 (ref 1.005–1.030)
pH: 6 (ref 5.0–8.0)

## 2022-11-14 LAB — CBC
HCT: 39.9 % (ref 36.0–46.0)
Hemoglobin: 12.9 g/dL (ref 12.0–15.0)
MCH: 27.3 pg (ref 26.0–34.0)
MCHC: 32.3 g/dL (ref 30.0–36.0)
MCV: 84.5 fL (ref 80.0–100.0)
Platelets: 464 10*3/uL — ABNORMAL HIGH (ref 150–400)
RBC: 4.72 MIL/uL (ref 3.87–5.11)
RDW: 12.6 % (ref 11.5–15.5)
WBC: 10.7 10*3/uL — ABNORMAL HIGH (ref 4.0–10.5)
nRBC: 0 % (ref 0.0–0.2)

## 2022-11-14 LAB — I-STAT BETA HCG BLOOD, ED (MC, WL, AP ONLY): I-stat hCG, quantitative: <5 m[IU]/mL (ref <5)

## 2022-11-14 MED ORDER — KETOROLAC TROMETHAMINE 30 MG/ML IJ SOLN
60.0000 mg | Freq: Once | INTRAMUSCULAR | Status: DC
  Filled 2022-11-14: qty 2

## 2022-11-14 MED ORDER — TAMSULOSIN HCL 0.4 MG PO CAPS: 0.4000 mg | ORAL_CAPSULE | Freq: Every day | ORAL | 0 refills | Status: DC

## 2022-11-14 MED ORDER — ONDANSETRON HCL 4 MG/2ML IJ SOLN
4.0000 mg | Freq: Once | INTRAMUSCULAR | Status: AC
  Administered 2022-11-14: 4 mg via INTRAVENOUS
  Filled 2022-11-14: qty 2

## 2022-11-14 MED ORDER — OXYCODONE HCL 5 MG PO TABS: 5.0000 mg | ORAL_TABLET | ORAL | 0 refills | Status: DC | PRN

## 2022-11-14 MED ORDER — ONDANSETRON 4 MG PO TBDP: ORAL_TABLET | ORAL | 1 refills | Status: DC

## 2022-11-14 MED ORDER — IBUPROFEN 600 MG PO TABS: 600.0000 mg | ORAL_TABLET | Freq: Four times a day (QID) | ORAL | 0 refills | Status: DC

## 2022-11-14 MED ORDER — HYDROMORPHONE HCL 1 MG/ML IJ SOLN
2.0000 mg | Freq: Once | INTRAMUSCULAR | Status: AC
  Administered 2022-11-14: 2 mg via INTRAVENOUS
  Filled 2022-11-14: qty 2

## 2022-11-14 MED ORDER — HYDROMORPHONE HCL 1 MG/ML IJ SOLN
1.0000 mg | Freq: Once | INTRAMUSCULAR | Status: AC
  Administered 2022-11-14: 1 mg via INTRAVENOUS
  Filled 2022-11-14: qty 1

## 2022-11-14 MED ORDER — HYDROMORPHONE HCL 1 MG/ML IJ SOLN
1.0000 mg | Freq: Once | INTRAMUSCULAR | Status: AC
  Administered 2022-11-14: 1 mg via INTRAMUSCULAR
  Filled 2022-11-14: qty 1

## 2022-11-14 MED ORDER — ONDANSETRON 4 MG PO TBDP
4.0000 mg | ORAL_TABLET | Freq: Once | ORAL | Status: AC
  Administered 2022-11-14: 4 mg via ORAL
  Filled 2022-11-14: qty 1

## 2022-11-14 MED ORDER — ONDANSETRON HCL 4 MG PO TABS: 4.0000 mg | ORAL_TABLET | Freq: Three times a day (TID) | ORAL | 0 refills | Status: DC | PRN

## 2022-11-14 MED ORDER — ONDANSETRON HCL 4 MG/2ML IJ SOLN: 4.0000 mg | Freq: Once | INTRAMUSCULAR | Status: DC

## 2022-11-14 MED ORDER — MORPHINE SULFATE (PF) 4 MG/ML IV SOLN
4.0000 mg | Freq: Once | INTRAVENOUS | Status: AC
  Administered 2022-11-14: 4 mg via INTRAVENOUS
  Filled 2022-11-14: qty 1

## 2022-11-14 MED ORDER — SODIUM CHLORIDE 0.9 % IV BOLUS: 500.0000 mL | Freq: Once | INTRAVENOUS | Status: DC

## 2022-11-14 MED ORDER — HYDROMORPHONE HCL 1 MG/ML IJ SOLN: 1.0000 mg | Freq: Once | INTRAMUSCULAR | Status: DC

## 2022-11-14 MED ORDER — SODIUM CHLORIDE 0.9 % IV BOLUS
1000.0000 mL | Freq: Once | INTRAVENOUS | Status: AC
  Administered 2022-11-14: 1000 mL via INTRAVENOUS

## 2022-11-14 NOTE — ED Triage Notes (Signed)
Pt here POV with sudden onset R flank pain 0600 today. Pt also endorses nausea and vomiting. Denies hematuria. Pt with hx kidney stones.

## 2022-11-14 NOTE — Discharge Instructions (Signed)
Follow-up with [facility] this week.  Return if any problems

## 2022-11-14 NOTE — Discharge Instructions (Signed)
You were evaluated today for right-sided flank pain.  A 5.9 mm stone was noted in the right ureter.  I have prescribed ibuprofen to be taken every 4 hours, Zofran to be taken up to every 8 hours as needed for nausea, Flomax to be taken daily, and oxycodone to be taken as directed as needed for breakthrough pain.  Please call and schedule appointment with urology for follow-up.  If your pain becomes unbearable, you become unable to tolerate oral fluids even with nausea medication, or develop other life-threatening symptoms please return to the emergency department.

## 2022-11-14 NOTE — ED Provider Notes (Signed)
EMERGENCY DEPARTMENT AT [facility] Provider Note   CSN: [id number] Arrival date & time: 11/14/22  1848     History {Add pertinent medical, surgical, social history, OB history to HPI:1} Chief Complaint  Patient presents with   Flank Pain    Rebekah Fields is a 32 y.o. female.  Patient complains of right flank pain from a kidney stone that was diagnosed earlier today.  She has not been able to keep her Percocets down.  She has been taken self n.p.o.   Flank Pain       Home Medications Prior to Admission medications   Medication Sig Start Date End Date Taking? Authorizing Provider  ergocalciferol (DRISDOL) 1.25 MG (50000 UT) capsule Take 1 capsule (50,000 Units total) by mouth once a week. 10/21/22  Yes Boscia, Kathlynn Grate, NP  ibuprofen (ADVIL) 600 MG tablet Take 1 tablet (600 mg total) by mouth every 6 (six) hours. Patient taking differently: Take 600 mg by mouth every 6 (six) hours as needed for mild pain. 11/14/22  Yes Darrick Grinder, PA-C  norethindrone-ethinyl estradiol-FE (LOESTRIN FE) 1-20 MG-MCG tablet Take 1 tablet by mouth daily. 10/21/22  Yes Boscia, Kathlynn Grate, NP  ondansetron (ZOFRAN) 4 MG tablet Take 1 tablet (4 mg total) by mouth every 8 (eight) hours as needed for nausea or vomiting. 11/14/22  Yes Barrie Dunker B, PA-C  ondansetron (ZOFRAN-ODT) 4 MG disintegrating tablet 4mg  ODT q4 hours prn nausea/vomit 11/14/22  Yes Bethann Berkshire, MD  oxyCODONE (ROXICODONE) 5 MG immediate release tablet Take 1 tablet (5 mg total) by mouth every 4 (four) hours as needed for severe pain. 11/14/22  Yes Darrick Grinder, PA-C  tamsulosin (FLOMAX) 0.4 MG CAPS capsule Take 1 capsule (0.4 mg total) by mouth daily. 11/14/22  Yes Darrick Grinder, PA-C      Allergies    Sulfa antibiotics, Lactose intolerance (gi), and Nickel    Review of Systems   Review of Systems  Genitourinary:  Positive for flank pain.    Physical Exam Updated Vital Signs BP (!)  163/104 (BP Location: Right Arm)   Pulse 79   Temp 99.6 F (37.6 C) (Oral)   Resp 18   Ht 5\' 3"  (1.6 m)   Wt 88 kg   LMP 10/07/2022   SpO2 100%   BMI 34.37 kg/m  Physical Exam  ED Results / Procedures / Treatments   Labs (all labs ordered are listed, but only abnormal results are displayed) Labs Reviewed - No data to display  EKG None  Radiology CT Renal Stone Study  Result Date: 11/14/2022 CLINICAL DATA:  Abdominal and flank pain. EXAM: CT ABDOMEN AND PELVIS WITHOUT CONTRAST TECHNIQUE: Multidetector CT imaging of the abdomen and pelvis was performed following the standard protocol without IV contrast. RADIATION DOSE REDUCTION: This exam was performed according to the departmental dose-optimization program which includes automated exposure control, adjustment of the mA and/or kV according to patient size and/or use of iterative reconstruction technique. COMPARISON:  11/08/2018 FINDINGS: Lower chest: Unremarkable. Hepatobiliary: The liver shows diffusely decreased attenuation suggesting fat deposition. There is no evidence for gallstones, gallbladder wall thickening, or pericholecystic fluid. No intrahepatic or extrahepatic biliary dilation. Pancreas: No focal mass lesion. No dilatation of the main duct. No intraparenchymal cyst. No peripancreatic edema. Spleen: No splenomegaly. No focal mass lesion. Adrenals/Urinary Tract: No adrenal nodule or mass. Several nonobstructing stones are again seen in the right kidney measuring up to 6 mm diameter. There is mild fullness of the  right kidney and ureter secondary to the presence of a 6 x 5 x 6 mm stone in the distal right ureter at the level of the crossing common iliac vessels. A single nonobstructive stone is identified in the lower pole left kidney measuring 3-4 mm. Left ureter unremarkable. The urinary bladder appears normal for the degree of distention. Stomach/Bowel: Stomach is unremarkable. No gastric wall thickening. No evidence of outlet  obstruction. Duodenum is normally positioned as is the ligament of Treitz. No small bowel wall thickening. No small bowel dilatation. The terminal ileum is normal. Nonvisualization of the appendix is consistent with the reported history of appendectomy. No gross colonic mass. No colonic wall thickening. Vascular/Lymphatic: No abdominal aortic aneurysm. No abdominal aortic atherosclerotic calcification. There is no gastrohepatic or hepatoduodenal ligament lymphadenopathy. No retroperitoneal or mesenteric lymphadenopathy. No pelvic sidewall lymphadenopathy. Reproductive: Unremarkable. Other: No intraperitoneal free fluid. Musculoskeletal: No worrisome lytic or sclerotic osseous abnormality. IMPRESSION: 1. 6 x 5 x 6 mm distal right ureteral stone with mild right hydroureteronephrosis. 2. Bilateral nonobstructing nephrolithiasis. 3. Hepatic steatosis. Electronically Signed   By: Kennith Center M.D.   On: 11/14/2022 09:59    Procedures Procedures  {Document cardiac monitor, telemetry assessment procedure when appropriate:1}  Medications Ordered in ED Medications  ketorolac (TORADOL) 30 MG/ML injection 60 mg (60 mg Intramuscular Not Given 11/14/22 2027)  HYDROmorphone (DILAUDID) injection 1 mg (1 mg Intramuscular Given 11/14/22 2110)  ondansetron (ZOFRAN-ODT) disintegrating tablet 4 mg (4 mg Oral Given 11/14/22 2110)    ED Course/ Medical Decision Making/ A&P  Patient was given Dilaudid and Toradol IM along with Zofran ODT and she feels much better.  She is able to keep her Percocets down.  She is given a prescription of Zofran and ODT {   Click here for ABCD2, HEART and other calculatorsREFRESH Note before signing :1}                          Medical Decision Making Risk Prescription drug management.   6 mm ureteral stone seen on CT scan on visit earlier today.  Patient will follow-up with [facility]  {Document critical care time when appropriate:1} {Document review of labs and clinical  decision tools ie heart score, Chads2Vasc2 etc:1}  {Document your independent review of radiology images, and any outside records:1} {Document your discussion with family members, caretakers, and with consultants:1} {Document social determinants of health affecting pt's care:1} {Document your decision making why or why not admission, treatments were needed:1} Final Clinical Impression(s) / ED Diagnoses Final diagnoses:  Kidney stone    Rx / DC Orders ED Discharge Orders          Ordered    ondansetron (ZOFRAN-ODT) 4 MG disintegrating tablet        11/14/22 2242

## 2022-11-14 NOTE — ED Triage Notes (Signed)
Pt arrives c/o ongoing R flank pain, n/v. Seen this AM at [facility] and diagnosed with 5.9 mm stone. Prescribed flomax, zofran, oxy and ibuprofen today. Has taken all these meds as prescribed without relief. Last dose of oxy prior to arrival, but states that she vomited afterward. Endorses lightheadedness during triage as well.

## 2022-11-14 NOTE — ED Notes (Signed)
Pt states that toradol tends to make her pain worse and dilaudid helped the best at previous visit today. Pt also nauseated and dry heaving. Provider notified.

## 2022-11-14 NOTE — ED Provider Notes (Signed)
[place] EMERGENCY DEPARTMENT AT [facility] Provider Note   CSN: [id number] Arrival date & time: 11/14/22  0809     History  Chief Complaint  Patient presents with   Flank Pain    Rebekah Fields is a 32 y.o. female.  Patient presents to the emergency department complaining of sudden onset right-sided flank pain which began at 6:30 AM this morning.  Patient endorses nausea and vomiting that began at the same time.  She denies dysuria, hematuria, shortness of breath, fevers.  She states she has a history of kidney stones and that this feels similar but more severe.  She states she last had to go to the hospital for kidney stone approximately 2 years ago and has never required surgical intervention due to her stones.  Past medical history significant for kidney stones, anxiety, PCOS  HPI     Home Medications Prior to Admission medications   Medication Sig Start Date End Date Taking? Authorizing Provider  ibuprofen (ADVIL) 600 MG tablet Take 1 tablet (600 mg total) by mouth every 6 (six) hours. 11/14/22  Yes Barrie Dunker B, PA-C  ondansetron (ZOFRAN) 4 MG tablet Take 1 tablet (4 mg total) by mouth every 8 (eight) hours as needed for nausea or vomiting. 11/14/22  Yes Barrie Dunker B, PA-C  oxyCODONE (ROXICODONE) 5 MG immediate release tablet Take 1 tablet (5 mg total) by mouth every 4 (four) hours as needed for severe pain. 11/14/22  Yes Darrick Grinder, PA-C  tamsulosin (FLOMAX) 0.4 MG CAPS capsule Take 1 capsule (0.4 mg total) by mouth daily. 11/14/22  Yes Darrick Grinder, PA-C  ergocalciferol (DRISDOL) 1.25 MG (50000 UT) capsule Take 1 capsule (50,000 Units total) by mouth once a week. 10/21/22   Carlean Jews, NP  norethindrone-ethinyl estradiol-FE (LOESTRIN FE) 1-20 MG-MCG tablet Take 1 tablet by mouth daily. 10/21/22   Carlean Jews, NP      Allergies    Sulfa antibiotics, Lactose intolerance (gi), and Nickel    Review of Systems   Review of  Systems  Physical Exam Updated Vital Signs BP (!) 148/107   Pulse 74   Resp 16   LMP 10/07/2022   SpO2 100%  Physical Exam Vitals and nursing note reviewed.  Constitutional:      General: She is not in acute distress.    Appearance: She is well-developed.  HENT:     Head: Normocephalic and atraumatic.     Mouth/Throat:     Mouth: Mucous membranes are moist.  Eyes:     Conjunctiva/sclera: Conjunctivae normal.  Cardiovascular:     Rate and Rhythm: Normal rate and regular rhythm.     Heart sounds: No murmur heard. Pulmonary:     Effort: Pulmonary effort is normal. No respiratory distress.     Breath sounds: Normal breath sounds.  Abdominal:     Palpations: Abdomen is soft.     Tenderness: There is abdominal tenderness. There is right CVA tenderness. There is no left CVA tenderness.  Musculoskeletal:        General: No swelling.     Cervical back: Neck supple.  Skin:    General: Skin is warm and dry.     Capillary Refill: Capillary refill takes less than 2 seconds.  Neurological:     Mental Status: She is alert.  Psychiatric:        Mood and Affect: Mood normal.     ED Results / Procedures / Treatments   Labs (all labs  ordered are listed, but only abnormal results are displayed) Labs Reviewed  COMPREHENSIVE METABOLIC PANEL - Abnormal; Notable for the following components:      Result Value   CO2 18 (*)    Glucose, Bld 158 (*)    Total Protein 8.5 (*)    All other components within normal limits  CBC - Abnormal; Notable for the following components:   WBC 10.7 (*)    Platelets 464 (*)    All other components within normal limits  URINALYSIS, ROUTINE W REFLEX MICROSCOPIC  I-STAT BETA HCG BLOOD, ED ([facility], [facility], [facility] ONLY)    EKG None  Radiology CT Renal Stone Study  Result Date: 11/14/2022 CLINICAL DATA:  Abdominal and flank pain. EXAM: CT ABDOMEN AND PELVIS WITHOUT CONTRAST TECHNIQUE: Multidetector CT imaging of the abdomen and pelvis was performed following the  standard protocol without IV contrast. RADIATION DOSE REDUCTION: This exam was performed according to the departmental dose-optimization program which includes automated exposure control, adjustment of the mA and/or kV according to patient size and/or use of iterative reconstruction technique. COMPARISON:  11/08/2018 FINDINGS: Lower chest: Unremarkable. Hepatobiliary: The liver shows diffusely decreased attenuation suggesting fat deposition. There is no evidence for gallstones, gallbladder wall thickening, or pericholecystic fluid. No intrahepatic or extrahepatic biliary dilation. Pancreas: No focal mass lesion. No dilatation of the main duct. No intraparenchymal cyst. No peripancreatic edema. Spleen: No splenomegaly. No focal mass lesion. Adrenals/Urinary Tract: No adrenal nodule or mass. Several nonobstructing stones are again seen in the right kidney measuring up to 6 mm diameter. There is mild fullness of the right kidney and ureter secondary to the presence of a 6 x 5 x 6 mm stone in the distal right ureter at the level of the crossing common iliac vessels. A single nonobstructive stone is identified in the lower pole left kidney measuring 3-4 mm. Left ureter unremarkable. The urinary bladder appears normal for the degree of distention. Stomach/Bowel: Stomach is unremarkable. No gastric wall thickening. No evidence of outlet obstruction. Duodenum is normally positioned as is the ligament of Treitz. No small bowel wall thickening. No small bowel dilatation. The terminal ileum is normal. Nonvisualization of the appendix is consistent with the reported history of appendectomy. No gross colonic mass. No colonic wall thickening. Vascular/Lymphatic: No abdominal aortic aneurysm. No abdominal aortic atherosclerotic calcification. There is no gastrohepatic or hepatoduodenal ligament lymphadenopathy. No retroperitoneal or mesenteric lymphadenopathy. No pelvic sidewall lymphadenopathy. Reproductive: Unremarkable. Other:  No intraperitoneal free fluid. Musculoskeletal: No worrisome lytic or sclerotic osseous abnormality. IMPRESSION: 1. 6 x 5 x 6 mm distal right ureteral stone with mild right hydroureteronephrosis. 2. Bilateral nonobstructing nephrolithiasis. 3. Hepatic steatosis. Electronically Signed   By: Kennith Center M.D.   On: 11/14/2022 09:59    Procedures Procedures    Medications Ordered in ED Medications  morphine (PF) 4 MG/ML injection 4 mg (4 mg Intravenous Given 11/14/22 0839)  ondansetron (ZOFRAN) injection 4 mg (4 mg Intravenous Given 11/14/22 0839)  sodium chloride 0.9 % bolus 1,000 mL (0 mLs Intravenous Stopped 11/14/22 1105)  HYDROmorphone (DILAUDID) injection 1 mg (1 mg Intravenous Given 11/14/22 0919)  HYDROmorphone (DILAUDID) injection 2 mg (2 mg Intravenous Given 11/14/22 1105)  ondansetron (ZOFRAN) injection 4 mg (4 mg Intravenous Given 11/14/22 1105)    ED Course/ Medical Decision Making/ A&P                             Medical Decision Making Amount and/or Complexity  of Data Reviewed Labs: ordered. Radiology: ordered.  Risk Prescription drug management.   This patient presents to the ED for concern of right-sided flank pain, this involves an extensive number of treatment options, and is a complaint that carries with it a high risk of complications and morbidity.  The differential diagnosis includes nephrolithiasis, pyelonephritis, hydronephrosis, others   Co morbidities that complicate the patient evaluation  History of renal stones, PCOS   Additional history obtained:  Additional history obtained from family at bedside   Lab Tests:  I Ordered, and personally interpreted labs.  The pertinent results include: Grossly unremarkable CBC, CMP.  Negative i-STAT beta hCG pregnancy test.   Imaging Studies ordered:  I ordered imaging studies including CT renal stone study I independently visualized and interpreted imaging which showed  1. 6 x 5 x 6 mm distal right ureteral  stone with mild right  hydroureteronephrosis.  2. Bilateral nonobstructing nephrolithiasis.  3. Hepatic steatosis   I agree with the radiologist interpretation    Problem List / ED Course / Critical interventions / Medication management   I ordered medication including morphine and Dilaudid for pain, Zofran for nausea, normal saline for fluids  Reevaluation of the patient after these medicines showed that the patient improved I have reviewed the patients home medicines and have made adjustments as needed   Test / Admission - Considered:  Patient has a 6 mm right ureteral stone with mild right-sided hydroureteronephrosis.  I considered admission due to pain control but at this time pain is under control and patient feels comfortable discharging home.  Plan to discharge home with plans for urologic follow-up.  Will prescribe scheduled ibuprofen, Roxicodone for breakthrough pain, Flomax, and Zofran.  Patient has been given strict return precautions including intractable pain or nausea, difficulty with urination.   Patient voices understanding with discharge plan and instructions.  No indication for admission at this time.         Final Clinical Impression(s) / ED Diagnoses Final diagnoses:  Ureteral stone with hydronephrosis    Rx / DC Orders ED Discharge Orders          Ordered    ibuprofen (ADVIL) 600 MG tablet  Every 6 hours        11/14/22 1216    oxyCODONE (ROXICODONE) 5 MG immediate release tablet  Every 4 hours PRN        11/14/22 1216    ondansetron (ZOFRAN) 4 MG tablet  Every 8 hours PRN        11/14/22 1216    tamsulosin (FLOMAX) 0.4 MG CAPS capsule  Daily        11/14/22 1216              Pamala Duffel 11/14/22 1218    Elayne Snare K, DO 11/14/22 1534

## 2022-11-14 NOTE — Telephone Encounter (Signed)
Patient called in and stated the pharmacy where the medication was prescribed is closed. [facility] [address] is open and that is where she would like it sent. Notified provider, sent to requested pharmacy

## 2022-11-16 ENCOUNTER — Emergency Department (HOSPITAL_COMMUNITY): Payer: 59

## 2022-11-16 ENCOUNTER — Encounter (HOSPITAL_COMMUNITY): Payer: Self-pay

## 2022-11-16 ENCOUNTER — Emergency Department (HOSPITAL_COMMUNITY)
Admission: EM | Admit: 2022-11-16 | Discharge: 2022-11-16 | Disposition: A | Discharge status: Home / Self Care | Payer: 59 | Attending: Emergency Medicine | Admitting: Emergency Medicine

## 2022-11-16 DIAGNOSIS — R109 Unspecified abdominal pain: Secondary | ICD-10-CM | POA: Diagnosis present

## 2022-11-16 DIAGNOSIS — N132 Hydronephrosis with renal and ureteral calculous obstruction: Secondary | ICD-10-CM | POA: Insufficient documentation

## 2022-11-16 DIAGNOSIS — N200 Calculus of kidney: Secondary | ICD-10-CM

## 2022-11-16 LAB — URINALYSIS, ROUTINE W REFLEX MICROSCOPIC
Bilirubin Urine: NEGATIVE
Glucose, UA: NEGATIVE mg/dL
Ketones, ur: 20 mg/dL — AB
Nitrite: NEGATIVE
Protein, ur: NEGATIVE mg/dL
Specific Gravity, Urine: 1.014 (ref 1.005–1.030)
pH: 5 (ref 5.0–8.0)

## 2022-11-16 LAB — CBC WITH DIFFERENTIAL/PLATELET
Abs Immature Granulocytes: 0.03 10*3/uL (ref 0.00–0.07)
Basophils Absolute: 0.1 10*3/uL (ref 0.0–0.1)
Basophils Relative: 1 %
Eosinophils Absolute: 0 10*3/uL (ref 0.0–0.5)
Eosinophils Relative: 0 %
HCT: 41.3 % (ref 36.0–46.0)
Hemoglobin: 13.7 g/dL (ref 12.0–15.0)
Immature Granulocytes: 0 %
Lymphocytes Relative: 14 %
Lymphs Abs: 1.5 10*3/uL (ref 0.7–4.0)
MCH: 28.1 pg (ref 26.0–34.0)
MCHC: 33.2 g/dL (ref 30.0–36.0)
MCV: 84.8 fL (ref 80.0–100.0)
Monocytes Absolute: 0.6 10*3/uL (ref 0.1–1.0)
Monocytes Relative: 6 %
Neutro Abs: 8 10*3/uL — ABNORMAL HIGH (ref 1.7–7.7)
Neutrophils Relative %: 79 %
Platelets: 473 10*3/uL — ABNORMAL HIGH (ref 150–400)
RBC: 4.87 MIL/uL (ref 3.87–5.11)
RDW: 12.7 % (ref 11.5–15.5)
WBC: 10.2 10*3/uL (ref 4.0–10.5)
nRBC: 0 % (ref 0.0–0.2)

## 2022-11-16 LAB — COMPREHENSIVE METABOLIC PANEL
ALT: 31 U/L (ref 0–44)
AST: 21 U/L (ref 15–41)
Albumin: 3.9 g/dL (ref 3.5–5.0)
Alkaline Phosphatase: 39 U/L (ref 38–126)
Anion gap: 10 (ref 5–15)
BUN: 11 mg/dL (ref 6–20)
CO2: 22 mmol/L (ref 22–32)
Calcium: 9.1 mg/dL (ref 8.9–10.3)
Chloride: 102 mmol/L (ref 98–111)
Creatinine, Ser: 1.26 mg/dL — ABNORMAL HIGH (ref 0.44–1.00)
GFR, Estimated: 59 mL/min — ABNORMAL LOW (ref >60)
Glucose, Bld: 115 mg/dL — ABNORMAL HIGH (ref 70–99)
Potassium: 3.6 mmol/L (ref 3.5–5.1)
Sodium: 134 mmol/L — ABNORMAL LOW (ref 135–145)
Total Bilirubin: 0.6 mg/dL (ref 0.3–1.2)
Total Protein: 8.8 g/dL — ABNORMAL HIGH (ref 6.5–8.1)

## 2022-11-16 LAB — LIPASE, BLOOD: Lipase: 30 U/L (ref 11–51)

## 2022-11-16 LAB — I-STAT BETA HCG BLOOD, ED (MC, WL, AP ONLY): I-stat hCG, quantitative: <5 m[IU]/mL (ref <5)

## 2022-11-16 MED ORDER — MORPHINE SULFATE (PF) 4 MG/ML IV SOLN
4.0000 mg | Freq: Once | INTRAVENOUS | Status: AC
  Administered 2022-11-16: 4 mg via INTRAVENOUS
  Filled 2022-11-16: qty 1

## 2022-11-16 MED ORDER — KETOROLAC TROMETHAMINE 15 MG/ML IJ SOLN
15.0000 mg | Freq: Once | INTRAMUSCULAR | Status: AC
  Administered 2022-11-16: 15 mg via INTRAVENOUS
  Filled 2022-11-16: qty 1

## 2022-11-16 MED ORDER — LACTATED RINGERS IV BOLUS
1000.0000 mL | Freq: Once | INTRAVENOUS | Status: AC
  Administered 2022-11-16: 1000 mL via INTRAVENOUS

## 2022-11-16 MED ORDER — ONDANSETRON 4 MG PO TBDP: 4.0000 mg | ORAL_TABLET | Freq: Three times a day (TID) | ORAL | 0 refills | Status: DC | PRN

## 2022-11-16 MED ORDER — ONDANSETRON HCL 4 MG/2ML IJ SOLN
4.0000 mg | Freq: Once | INTRAMUSCULAR | Status: AC
  Administered 2022-11-16: 4 mg via INTRAVENOUS
  Filled 2022-11-16: qty 2

## 2022-11-16 MED ORDER — HYDROMORPHONE HCL 1 MG/ML IJ SOLN
1.0000 mg | Freq: Once | INTRAMUSCULAR | Status: AC
  Administered 2022-11-16: 1 mg via INTRAVENOUS
  Filled 2022-11-16: qty 1

## 2022-11-16 NOTE — Discharge Instructions (Signed)
You have a 7 mm stone on the right side..  No evidence of septic stone.  Your symptoms were managed with medications in the emergency department.  We did discuss your case with urology.  He is in agreement with discharge and close follow-up in his clinic.  For any worsening symptoms return to the emergency department.  If your symptoms are not controlled urology can place a stent until they are able to remove the stone.

## 2022-11-16 NOTE — ED Provider Notes (Addendum)
[facility] AT [facility] Provider Note   CSN: [id number] Arrival date & time: 11/16/22  1103     History  Chief Complaint  Patient presents with   Abdominal Pain    Rebekah Fields is a 32 y.o. female.  31 year old female presents today for evaluation of worsening flank pain, nausea, vomiting.  He was seen 2 days ago in the emergency department was diagnosed with 5.9 mm kidney stone.  She feels that she might of passed it this morning.  However today her symptoms have been uncontrolled from a pain standpoint as well as from uncontrolled vomiting.  She states yesterday she had a fairly decent day and she was able to tolerate some p.o. intake.  Denies fever, dysuria, or any other complaints.  The history is provided by the patient. No language interpreter was used.       Home Medications Prior to Admission medications   Medication Sig Start Date End Date Taking? Authorizing Provider  ergocalciferol (DRISDOL) 1.25 MG (50000 UT) capsule Take 1 capsule (50,000 Units total) by mouth once a week. 10/21/22   Carlean Jews, NP  ibuprofen (ADVIL) 600 MG tablet Take 1 tablet (600 mg total) by mouth every 6 (six) hours. Patient taking differently: Take 600 mg by mouth every 6 (six) hours as needed for mild pain. 11/14/22   Darrick Grinder, PA-C  norethindrone-ethinyl estradiol-FE (LOESTRIN FE) 1-20 MG-MCG tablet Take 1 tablet by mouth daily. 10/21/22   Carlean Jews, NP  ondansetron (ZOFRAN) 4 MG tablet Take 1 tablet (4 mg total) by mouth every 8 (eight) hours as needed for nausea or vomiting. 11/14/22   Barrie Dunker B, PA-C  ondansetron (ZOFRAN-ODT) 4 MG disintegrating tablet 4mg  ODT q4 hours prn nausea/vomit 11/14/22   Bethann Berkshire, MD  oxyCODONE (ROXICODONE) 5 MG immediate release tablet Take 1 tablet (5 mg total) by mouth every 4 (four) hours as needed for severe pain. 11/14/22   Darrick Grinder, PA-C  tamsulosin (FLOMAX) 0.4 MG CAPS capsule Take  1 capsule (0.4 mg total) by mouth daily. 11/14/22   Darrick Grinder, PA-C      Allergies    Sulfa antibiotics, Lactose intolerance (gi), and Nickel    Review of Systems   Review of Systems  Constitutional:  Negative for fever.  Gastrointestinal:  Positive for nausea and vomiting. Negative for abdominal pain.  Genitourinary:  Positive for flank pain. Negative for dysuria and hematuria.  All other systems reviewed and are negative.   Physical Exam Updated Vital Signs BP 138/83   Pulse 75   Temp 98.4 F (36.9 C) (Oral)   Resp 16   LMP 10/07/2022   SpO2 100%  Physical Exam Vitals and nursing note reviewed.  Constitutional:      General: She is not in acute distress.    Appearance: Normal appearance. She is not ill-appearing.  HENT:     Head: Normocephalic and atraumatic.     Nose: Nose normal.  Eyes:     General: No scleral icterus.    Extraocular Movements: Extraocular movements intact.     Conjunctiva/sclera: Conjunctivae normal.  Cardiovascular:     Rate and Rhythm: Normal rate and regular rhythm.     Pulses: Normal pulses.     Heart sounds: Normal heart sounds.  Pulmonary:     Effort: Pulmonary effort is normal. No respiratory distress.     Breath sounds: Normal breath sounds. No wheezing or rales.  Abdominal:  General: There is no distension.     Tenderness: There is no abdominal tenderness. There is no guarding.  Musculoskeletal:        General: Normal range of motion.     Cervical back: Normal range of motion.  Skin:    General: Skin is warm and dry.  Neurological:     General: No focal deficit present.     Mental Status: She is alert. Mental status is at baseline.     ED Results / Procedures / Treatments   Labs (all labs ordered are listed, but only abnormal results are displayed) Labs Reviewed  CBC WITH DIFFERENTIAL/PLATELET - Abnormal; Notable for the following components:      Result Value   Platelets 473 (*)    Neutro Abs 8.0 (*)    All  other components within normal limits  COMPREHENSIVE METABOLIC PANEL - Abnormal; Notable for the following components:   Sodium 134 (*)    Glucose, Bld 115 (*)    Creatinine, Ser 1.26 (*)    Total Protein 8.8 (*)    GFR, Estimated 59 (*)    All other components within normal limits  LIPASE, BLOOD  URINALYSIS, ROUTINE W REFLEX MICROSCOPIC  I-STAT BETA HCG BLOOD, ED ([facility], [facility], [facility] ONLY)    EKG None  Radiology No results found.  Procedures Procedures    Medications Ordered in ED Medications  morphine (PF) 4 MG/ML injection 4 mg (4 mg Intravenous Given 11/16/22 1204)  ondansetron (ZOFRAN) injection 4 mg (4 mg Intravenous Given 11/16/22 1204)  lactated ringers bolus 1,000 mL (1,000 mLs Intravenous New Bag/Given 11/16/22 1213)    ED Course/ Medical Decision Making/ A&P                             Medical Decision Making Amount and/or Complexity of Data Reviewed Labs: ordered. Radiology: ordered.  Risk Prescription drug management.   Medical Decision Making / ED Course   This patient presents to the ED for concern of flank pain, this involves an extensive number of treatment options, and is a complaint that carries with it a high risk of complications and morbidity.  The differential diagnosis includes nephrolithiasis, pyelonephritis, UTI  MDM: 32 year old female with history of kidney stone presents today for evaluation of worsening flank pain, and uncontrolled symptoms including vomiting.  Not able to keep any p.o. intake down or medications.  Will obtain labs, will repeat CT renal stone study to evaluate for kidney stone.  CT renal stone study shows obstructive 7 mm stone distal right ureter.  Mild renal insufficiency at creatinine of 1.2.  She states she has not been taking her Zofran as scheduled.  Which has sort of led to the reason why she has not been able to keep her medicines, and other p.o. intake down.  This is her third visit in the past 3 days of his kidney stone.   Discussed symptom management and follow-up with urology clinic.  No evidence of septic stone.  She is agreeable.  Will discuss with urology to ensure they are in agreement with plan.  UA without evidence of apparent UTI.  Trace leukocytes and only 11-20 WBCs.  Negative nitrite.  Without UTI symptoms.  Discussed with Dr. Kathrynn Running of urology.  He will review patient's imaging and labs.  He feels that as long as patient has manageable symptoms she can follow-up outpatient.  Patient prefers to avoid 2 procedures and would like to follow-up  outpatient with urology.  She is appropriate for discharge.  Discharged in stable condition.  Return precautions discussed.  Patient voices understanding and is in agreement with plan.  Lab Tests: -I ordered, reviewed, and interpreted labs.   The pertinent results include:   Labs Reviewed  CBC WITH DIFFERENTIAL/PLATELET - Abnormal; Notable for the following components:      Result Value   Platelets 473 (*)    Neutro Abs 8.0 (*)    All other components within normal limits  COMPREHENSIVE METABOLIC PANEL - Abnormal; Notable for the following components:   Sodium 134 (*)    Glucose, Bld 115 (*)    Creatinine, Ser 1.26 (*)    Total Protein 8.8 (*)    GFR, Estimated 59 (*)    All other components within normal limits  LIPASE, BLOOD  URINALYSIS, ROUTINE W REFLEX MICROSCOPIC  I-STAT BETA HCG BLOOD, ED ([facility], [facility], [facility] ONLY)      EKG  EKG Interpretation  Date/Time:    Ventricular Rate:    PR Interval:    QRS Duration:   QT Interval:    QTC Calculation:   R Axis:     Text Interpretation:           Imaging Studies ordered: I ordered imaging studies including ct renal stone I independently visualized and interpreted imaging. I agree with the radiologist interpretation   Medicines ordered and prescription drug management: Meds ordered this encounter  Medications   morphine (PF) 4 MG/ML injection 4 mg   ondansetron (ZOFRAN) injection 4 mg    lactated ringers bolus 1,000 mL    -I have reviewed the patients home medicines and have made adjustments as needed  Critical interventions Symptom control   Reevaluation: After the interventions noted above, I reevaluated the patient and found that they have :improved  Co morbidities that complicate the patient evaluation  Past Medical History:  Diagnosis Date   Allergy    Anxiety    Depression    Kidney stones    PCOS (polycystic ovarian syndrome)       Dispostion: Patient discharged in stable condition.  She discussed.  Patient voices understanding and is in agreement with plan.   Final Clinical Impression(s) / ED Diagnoses Final diagnoses:  Kidney stone    Rx / DC Orders ED Discharge Orders          Ordered    ondansetron (ZOFRAN-ODT) 4 MG disintegrating tablet  Every 8 hours PRN        11/16/22 1446              Marita Kansas, PA-C 11/16/22 1448    Marita Kansas, PA-C 11/16/22 1449    Pricilla Loveless, MD 11/19/22 1057

## 2022-11-16 NOTE — ED Triage Notes (Signed)
C/o abdominal pain, n/v. Was seen here 11/14/22, feeling better yesterday but today increase in pain, vomiting. Has been taking all of the prescribed medicine without relief. Admits that she thought she passed the stone yesterday

## 2022-11-16 NOTE — ED Notes (Signed)
Pt to ct via stretcher

## 2022-11-18 DIAGNOSIS — N911 Secondary amenorrhea: Secondary | ICD-10-CM | POA: Insufficient documentation

## 2022-11-18 NOTE — Assessment & Plan Note (Signed)
Continue norethindrone-FE daily -refills provided today

## 2022-11-18 NOTE — Assessment & Plan Note (Signed)
Continue norethindrone-FE daily -refills provided today  

## 2022-11-18 NOTE — Assessment & Plan Note (Signed)
Continue Drisdol 50000 iu weekly, which is high dose vitamin d. She will take this weekly for next few months.  

## 2022-11-26 ENCOUNTER — Emergency Department: Payer: 59

## 2022-11-26 ENCOUNTER — Other Ambulatory Visit: Payer: Self-pay

## 2022-11-26 ENCOUNTER — Emergency Department
Admission: EM | Admit: 2022-11-26 | Discharge: 2022-11-26 | Disposition: A | Discharge status: Home / Self Care | Payer: 59 | Attending: Emergency Medicine | Admitting: Emergency Medicine

## 2022-11-26 DIAGNOSIS — Z960 Presence of urogenital implants: Secondary | ICD-10-CM | POA: Diagnosis not present

## 2022-11-26 DIAGNOSIS — R109 Unspecified abdominal pain: Secondary | ICD-10-CM | POA: Insufficient documentation

## 2022-11-26 DIAGNOSIS — R112 Nausea with vomiting, unspecified: Secondary | ICD-10-CM | POA: Insufficient documentation

## 2022-11-26 LAB — LIPASE, BLOOD: Lipase: 34 U/L (ref 11–51)

## 2022-11-26 LAB — COMPREHENSIVE METABOLIC PANEL
ALT: 123 U/L — ABNORMAL HIGH (ref 0–44)
AST: 53 U/L — ABNORMAL HIGH (ref 15–41)
Albumin: 4.3 g/dL (ref 3.5–5.0)
Alkaline Phosphatase: 43 U/L (ref 38–126)
Anion gap: 10 (ref 5–15)
BUN: 13 mg/dL (ref 6–20)
CO2: 28 mmol/L (ref 22–32)
Calcium: 9.9 mg/dL (ref 8.9–10.3)
Chloride: 104 mmol/L (ref 98–111)
Creatinine, Ser: 0.99 mg/dL (ref 0.44–1.00)
GFR, Estimated: >60 mL/min (ref >60)
Glucose, Bld: 109 mg/dL — ABNORMAL HIGH (ref 70–99)
Potassium: 3.9 mmol/L (ref 3.5–5.1)
Sodium: 142 mmol/L (ref 135–145)
Total Bilirubin: 0.9 mg/dL (ref 0.3–1.2)
Total Protein: 9.4 g/dL — ABNORMAL HIGH (ref 6.5–8.1)

## 2022-11-26 LAB — URINALYSIS, ROUTINE W REFLEX MICROSCOPIC
Bilirubin Urine: NEGATIVE
Glucose, UA: NEGATIVE mg/dL
Ketones, ur: 80 mg/dL — AB
Nitrite: NEGATIVE
Protein, ur: 100 mg/dL — AB
RBC / HPF: >50 RBC/hpf (ref 0–5)
Specific Gravity, Urine: 1.02 (ref 1.005–1.030)
WBC, UA: >50 WBC/hpf (ref 0–5)
pH: 8 (ref 5.0–8.0)

## 2022-11-26 LAB — CBC
HCT: 44.3 % (ref 36.0–46.0)
Hemoglobin: 14.1 g/dL (ref 12.0–15.0)
MCH: 27.3 pg (ref 26.0–34.0)
MCHC: 31.8 g/dL (ref 30.0–36.0)
MCV: 85.9 fL (ref 80.0–100.0)
Platelets: 465 10*3/uL — ABNORMAL HIGH (ref 150–400)
RBC: 5.16 MIL/uL — ABNORMAL HIGH (ref 3.87–5.11)
RDW: 12.4 % (ref 11.5–15.5)
WBC: 11.4 10*3/uL — ABNORMAL HIGH (ref 4.0–10.5)
nRBC: 0 % (ref 0.0–0.2)

## 2022-11-26 LAB — POC URINE PREG, ED: Preg Test, Ur: NEGATIVE

## 2022-11-26 MED ORDER — ONDANSETRON HCL 4 MG/2ML IJ SOLN
4.0000 mg | Freq: Once | INTRAMUSCULAR | Status: AC
  Administered 2022-11-26: 4 mg via INTRAVENOUS
  Filled 2022-11-26: qty 2

## 2022-11-26 MED ORDER — LIDOCAINE VISCOUS HCL 2 % MT SOLN
15.0000 mL | Freq: Once | OROMUCOSAL | Status: AC
  Administered 2022-11-26: 15 mL via ORAL
  Filled 2022-11-26: qty 15

## 2022-11-26 MED ORDER — MORPHINE SULFATE (PF) 4 MG/ML IV SOLN
4.0000 mg | Freq: Once | INTRAVENOUS | Status: AC
  Administered 2022-11-26: 4 mg via INTRAVENOUS
  Filled 2022-11-26: qty 1

## 2022-11-26 MED ORDER — LACTATED RINGERS IV BOLUS
1000.0000 mL | Freq: Once | INTRAVENOUS | Status: AC
  Administered 2022-11-26: 1000 mL via INTRAVENOUS

## 2022-11-26 MED ORDER — ALUM & MAG HYDROXIDE-SIMETH 200-200-20 MG/5ML PO SUSP
30.0000 mL | Freq: Once | ORAL | Status: AC
  Administered 2022-11-26: 30 mL via ORAL
  Filled 2022-11-26: qty 30

## 2022-11-26 NOTE — ED Provider Notes (Signed)
[facility] Provider Note    Event Date/Time   First MD Initiated Contact with Patient 11/26/22 1222     (approximate)   History   Chief Complaint Emesis and Abdominal Pain   HPI  Rebekah Fields is a 32 y.o. female with past medical history of PCOS and kidney stones who presents to the ED complaining of nausea and vomiting.  Patient reports that she has been dealing with symptoms of kidney stones for multiple weeks, was seen by Dr. Alvester Morin of urology in [place] and had bilateral ureteral stenting performed 2 days ago.  Since then, she has been dealing with frequent nausea and multiple episodes of vomiting.  She initially had some pain in her abdomen which she attributed to the stents, followed up with her urologist yesterday when she reportedly had x-ray and ultrasound that showed appropriate positioning of the stents.  She was given a dose of pain medication and nausea medication, symptoms improved and she returned home.  She states that the pain remains improved, but she has continued to feel nauseous with multiple episodes of vomiting.  She has been unable to keep down either liquids or solids, felt very lightheaded like she was going to pass out earlier today.  She has been taking ODT Zofran at home without relief.  She endorses some dysuria but denies any fevers.     Physical Exam   Triage Vital Signs: ED Triage Vitals  Enc Vitals Group     BP 11/26/22 1213 (!) 157/110     Pulse Rate 11/26/22 1213 (!) 101     Resp 11/26/22 1213 20     Temp 11/26/22 1213 98.9 F (37.2 C)     Temp Source 11/26/22 1213 Oral     SpO2 11/26/22 1213 99 %     Weight 11/26/22 1207 190 lb (86.2 kg)     Height 11/26/22 1207 5\' 3"  (1.6 m)     Head Circumference --      Peak Flow --      Pain Score 11/26/22 1207 4     Pain Loc --      Pain Edu? --      Excl. in GC? --     Most recent vital signs: Vitals:   11/26/22 1213 11/26/22 1742  BP: (!) 157/110 (!) 141/92   Pulse: (!) 101 (!) 106  Resp: 20 18  Temp: 98.9 F (37.2 C) 99.5 F (37.5 C)  SpO2: 99% 99%    Constitutional: Alert and oriented. Eyes: Conjunctivae are normal. Head: Atraumatic. Nose: No congestion/rhinnorhea. Mouth/Throat: Mucous membranes are dry. Cardiovascular: Normal rate, regular rhythm. Grossly normal heart sounds.  2+ radial pulses bilaterally. Respiratory: Normal respiratory effort.  No retractions. Lungs CTAB. Gastrointestinal: Soft and nontender.  No CVA tenderness bilaterally.  No distention. Musculoskeletal: No lower extremity tenderness nor edema.  Neurologic:  Normal speech and language. No gross focal neurologic deficits are appreciated.    ED Results / Procedures / Treatments   Labs (all labs ordered are listed, but only abnormal results are displayed) Labs Reviewed  COMPREHENSIVE METABOLIC PANEL - Abnormal; Notable for the following components:      Result Value   Glucose, Bld 109 (*)    Total Protein 9.4 (*)    AST 53 (*)    ALT 123 (*)    All other components within normal limits  CBC - Abnormal; Notable for the following components:   WBC 11.4 (*)    RBC 5.16 (*)  Platelets 465 (*)    All other components within normal limits  URINALYSIS, ROUTINE W REFLEX MICROSCOPIC - Abnormal; Notable for the following components:   Color, Urine YELLOW (*)    APPearance CLOUDY (*)    Hgb urine dipstick LARGE (*)    Ketones, ur 80 (*)    Protein, ur 100 (*)    Leukocytes,Ua LARGE (*)    Bacteria, UA RARE (*)    All other components within normal limits  URINE CULTURE  LIPASE, BLOOD  POC URINE PREG, ED   RADIOLOGY CT renal protocol reviewed and interpreted by me with appropriate positioning of bilateral stents, no hydronephrosis.  PROCEDURES:  Critical Care performed: No  Procedures   MEDICATIONS ORDERED IN ED: Medications  ondansetron (ZOFRAN) injection 4 mg (has no administration in time range)  lactated ringers bolus 1,000 mL (0 mLs  Intravenous Stopped 11/26/22 1441)  ondansetron (ZOFRAN) injection 4 mg (4 mg Intravenous Given 11/26/22 1302)  alum & mag hydroxide-simeth (MAALOX/MYLANTA) 200-200-20 MG/5ML suspension 30 mL (30 mLs Oral Given 11/26/22 1319)    And  lidocaine (XYLOCAINE) 2 % viscous mouth solution 15 mL (15 mLs Oral Given 11/26/22 1319)  morphine (PF) 4 MG/ML injection 4 mg (4 mg Intravenous Given 11/26/22 1413)     IMPRESSION / MDM / ASSESSMENT AND PLAN / ED COURSE  I reviewed the triage vital signs and the nursing notes.                              32 y.o. female with past medical history of PCOS and kidney stones who presents to the ED complaining of persistent nausea and vomiting following bilateral ureteral stenting for kidney stones 2 days ago.  Patient's presentation is most consistent with acute presentation with potential threat to life or bodily function.  Differential diagnosis includes, but is not limited to, infected stone, pyelonephritis, cholecystitis, biliary colic, hepatitis, pancreatitis, dehydration, electrolyte abnormality, AKI.  Patient dehydrated appearing but otherwise well and in no acute distress, vital signs remarkable for tachycardia and hypertension.  She has a benign abdominal exam with no tenderness, reportedly had imaging performed yesterday that showed appropriate positioning of both stents.  Labs without significant anemia or leukocytosis, no AKI or electrolyte abnormality noted.  She does have mild transaminitis with ALT greater than AST, but no elevation in bilirubin.  We will further assess with right upper quadrant ultrasound, treat symptomatically with IV Zofran and hydrate with IV fluids.  Urinalysis pending at this time, but some of her dysuria may be due to presence of stents.  Right upper quadrant ultrasound is unremarkable, CT imaging also performed and shows appropriate positioning of ureteral stents with resolved kidney stones and hydronephrosis.  Urinalysis shows  possible infection and results were reviewed with Dr. Arita Miss from urology.  She agrees that infection seems unlikely but does recommend sending urine for culture.  We will hold off on antibiotics, patient feeling better following nausea medication and GI cocktail.  She is tolerating oral intake without difficulty and is appropriate for discharge home with urology follow-up.  She was counseled to return to the ED for new or worsening symptoms, patient agrees with plan.      FINAL CLINICAL IMPRESSION(S) / ED DIAGNOSES   Final diagnoses:  Nausea and vomiting, unspecified vomiting type  Ureteral stent present     Rx / DC Orders   ED Discharge Orders     None  Note:  This document was prepared using Dragon voice recognition software and may include unintentional dictation errors.   Chesley Noon, MD 11/26/22 [phone]

## 2022-11-26 NOTE — ED Triage Notes (Signed)
Pt to ED with mother, pt having constant vomiting and some abdominal pain since procedure on Wed (2d ago) to remove 3 kidney stones (from both kidneys and also from ureter near bladder). Pt has 2 ureteral stents. Pt has vomited about 6-8 times today since this AM. Denies flank pain or urinary retention. Mother states is concerned that she is concerned because pt is probably dehydrated and may be weak from all the vomiting.  Had xray and ultrasound yesterday and stents were not occluded. Mother states pain meds also maybe making her sick and has been taking zofran. Took twice today.

## 2022-11-26 NOTE — ED Notes (Signed)
Pt states her pain hasn't subsided

## 2022-11-27 LAB — URINE CULTURE: Culture: NO GROWTH

## 2023-02-04 ENCOUNTER — Other Ambulatory Visit: Payer: Self-pay | Admitting: Family Medicine

## 2023-02-04 DIAGNOSIS — E559 Vitamin D deficiency, unspecified: Secondary | ICD-10-CM

## 2023-02-04 DIAGNOSIS — Z0000 Encounter for general adult medical examination without abnormal findings: Secondary | ICD-10-CM

## 2023-02-14 ENCOUNTER — Other Ambulatory Visit: Payer: Medicaid Other

## 2023-02-14 DIAGNOSIS — E559 Vitamin D deficiency, unspecified: Secondary | ICD-10-CM

## 2023-02-14 DIAGNOSIS — Z0000 Encounter for general adult medical examination without abnormal findings: Secondary | ICD-10-CM

## 2023-02-25 ENCOUNTER — Encounter: Payer: Self-pay | Admitting: Family Medicine

## 2023-02-25 ENCOUNTER — Ambulatory Visit: Payer: 59 | Admitting: Nurse Practitioner

## 2023-02-25 ENCOUNTER — Ambulatory Visit (INDEPENDENT_AMBULATORY_CARE_PROVIDER_SITE_OTHER): Payer: 59 | Admitting: Family Medicine

## 2023-02-25 VITALS — BP 133/88 | HR 86 | Ht 63.0 in | Wt 190.0 lb

## 2023-02-25 DIAGNOSIS — R052 Subacute cough: Secondary | ICD-10-CM | POA: Diagnosis not present

## 2023-02-25 DIAGNOSIS — F419 Anxiety disorder, unspecified: Secondary | ICD-10-CM

## 2023-02-25 DIAGNOSIS — F32A Depression, unspecified: Secondary | ICD-10-CM

## 2023-02-25 MED ORDER — ESCITALOPRAM OXALATE 10 MG PO TABS: 10.0000 mg | ORAL_TABLET | Freq: Every day | ORAL | 2 refills | Status: DC

## 2023-02-25 NOTE — Patient Instructions (Signed)
It was nice to see you today,  We addressed the following topics today: -I will send in your anxiety medication to the [facility] on [address].  Take this daily and follow back up with [place] in 6 weeks - Continue with the diet [place] modifications we discussed including decreasing carbohydrates and saturated fats.  Try to exercise routinely.  Easiest thing to do is go for a walk for 20 to 30 minutes after dinner. - For your cough I would like you to try over-the-counter Zyrtec or Claritin once a day for 2 weeks.  If your cough does not improve during this time, you can stop taking that and start taking an over-the-counter antacid such as pantoprazole daily before bedtime.  Also do this for 2 weeks.  If neither of these things resolve your cough we will discuss further workup at your visit 6 weeks from now.  Have a great day,  Frederic Jericho, MD

## 2023-02-25 NOTE — Assessment & Plan Note (Signed)
History of seasonal allergies.  No history of acid reflux.  Lung exam was normal.  Advised patient to try over-the-counter second-generation antihistamine for 2 weeks daily.  If this does not improve cough, recommended switching to over-the-counter nightly antacid for 2 weeks.  Neither of these help, and cough persists at next visit will pursue further workup.

## 2023-02-25 NOTE — Progress Notes (Signed)
Established Patient Office Visit  Subjective   Patient ID: Rebekah Fields, female    DOB: 1991/05/01  Age: 32 y.o. MRN: [id number]  Chief Complaint  Patient presents with   Annual Exam    HPI Patient is here for her annual physical.  She is only taking birth control and vitamin D.  Not on any other medications.  We discussed her labs including the prediabetes and mildly elevated cholesterol level.  Discussed dietary changes including decreased carbohydrate intake and decrease saturated fat intake.  Discussed exercise and weight loss.  Patient feels like over the past 6 months her anxiety is worsening.  Was on escitalopram in the past and was eventually taken off of it by her prescriber.  She did not have any issues tolerating the medication.  He is okay with going back on that medication.  Patient had previously taken it for anxiety as well as depression.  Now feels like it is mostly anxiety that her issue.  She is in a relationship, no children.  Works as a Scientific laboratory technician at [employer]  Patient has a history of kidney stones.  Sees [facility] in the past.  Had to have several of them removed.  Not currently having any issues with kidney stones.  Patient has had 3 weeks of a dry cough.  Has a history of seasonal allergies is not currently taking any allergy medication.  No history of acid reflux.  No other respiratory symptoms such as rhinorrhea, sore throat.  No fever.  Patient takes her birth control medication for management of PCOS.  The ASCVD Risk score (Arnett DK, et al., 2019) failed to calculate for the following reasons:   The 2019 ASCVD risk score is only valid for ages 4 to 50  Health Maintenance Due  Topic Date Due   HIV Screening  Never done   Hepatitis C Screening  Never done   COVID-19 Vaccine (4 - 2023-24 season) 03/05/2022   INFLUENZA VACCINE  02/03/2023      Objective:     BP 133/88   Pulse 86   Ht 5\' 3"   (1.6 m)   Wt 190 lb (86.2 kg)   LMP 02/04/2023   SpO2 99%   BMI 33.66 kg/m    Physical Exam General: Alert, oriented HEENT: PERRLA, EOMI, moist oral mucosa CV: Regular rate and rhythm Pulmonary: Lungs are bilaterally GI: Soft, normal bowel sounds MSK: Strength equal bilaterally Extremities: No pedal edema Psych: Pleasant affect, spontaneous speech, good eye contact.   No results found for any visits on 02/25/23.      Assessment & Plan:   Subacute cough Assessment & Plan: History of seasonal allergies.  No history of acid reflux.  Lung exam was normal.  Advised patient to try over-the-counter second-generation antihistamine for 2 weeks daily.  If this does not improve cough, recommended switching to over-the-counter nightly antacid for 2 weeks.  Neither of these help, and cough persists at next visit will pursue further workup.   Anxiety and depression Assessment & Plan: Has taken escitalopram in the past.  Tolerated it well.  Would like to go back on that medication.  Will resend in.  Advised patient to take once daily.  Will follow-up in 6 weeks.   Other orders -     Escitalopram Oxalate; Take 1 tablet (10 mg total) by mouth daily.  Dispense: 30 tablet; Refill: 2     Return in about 6 weeks (around 04/08/2023) for Anxiety.  Sandre Kitty, MD

## 2023-02-25 NOTE — Assessment & Plan Note (Signed)
Has taken escitalopram in the past.  Tolerated it well.  Would like to go back on that medication.  Will resend in.  Advised patient to take once daily.  Will follow-up in 6 weeks.

## 2023-04-06 ENCOUNTER — Other Ambulatory Visit: Payer: Self-pay | Admitting: Nurse Practitioner

## 2023-04-06 DIAGNOSIS — E559 Vitamin D deficiency, unspecified: Secondary | ICD-10-CM

## 2023-04-08 ENCOUNTER — Ambulatory Visit (INDEPENDENT_AMBULATORY_CARE_PROVIDER_SITE_OTHER): Payer: 59 | Admitting: Family Medicine

## 2023-04-08 ENCOUNTER — Encounter: Payer: Self-pay | Admitting: Family Medicine

## 2023-04-08 VITALS — BP 133/88 | HR 87 | Ht 63.0 in | Wt 191.4 lb

## 2023-04-08 DIAGNOSIS — F419 Anxiety disorder, unspecified: Secondary | ICD-10-CM

## 2023-04-08 DIAGNOSIS — R052 Subacute cough: Secondary | ICD-10-CM

## 2023-04-08 DIAGNOSIS — F32A Depression, unspecified: Secondary | ICD-10-CM | POA: Diagnosis not present

## 2023-04-08 DIAGNOSIS — E559 Vitamin D deficiency, unspecified: Secondary | ICD-10-CM

## 2023-04-08 NOTE — Patient Instructions (Signed)
It was nice to see you today,  We addressed the following topics today: -Continue your Lexapro.  Next time you need a refill I can send it in as a 90-day prescription. - For your vitamin D levels, now you can transition to over-the-counter daily supplement.  Take 1000 to 2000 IUs of vitamin D 3 daily. - I would like to follow-up with you in 6 months.  Have a great day,  Frederic Jericho, MD

## 2023-04-08 NOTE — Assessment & Plan Note (Signed)
Patient feels much better on her current dose of Lexapro.  Patient does not wish to change dosing.  GAD score of 1.  At next refill will do 90-day supply.

## 2023-04-08 NOTE — Assessment & Plan Note (Signed)
Switching from weekly vitamin D 50K to daily over-the-counter vitamin D3.  Recommend 1000 or 2000 units daily.  Can recheck again in 1 year.

## 2023-04-08 NOTE — Progress Notes (Signed)
Established Patient Office Visit  Subjective   Patient ID: Rebekah Fields, female    DOB: [birth year]  Age: 32 y.o. MRN: [id number]  Chief Complaint  Patient presents with   Medical Management of Chronic Issues    HPI Patient no longer complaining of cough.  That has resolved.  Anxiety-patient feels much better on her current dose of Lexapro.  Just got a recent refill.  Does not want to change doses at this time.  Vitamin D deficiency-we discussed patient's most recent vitamin D level being normal.  She is out of her weekly vitamin D dosing and we discussed transitioning to daily vitamin D dose at 1000 to 2000 IUs a day.    The ASCVD Risk score (Arnett DK, et al., 2019) failed to calculate for the following reasons:   The 2019 ASCVD risk score is only valid for ages 62 to 35  Health Maintenance Due  Topic Date Due   HIV Screening  Never done   Hepatitis C Screening  Never done   COVID-19 Vaccine (4 - 2023-24 season) 03/06/2023      Objective:     BP 133/88   Pulse 87   Ht 5\' 3"  (1.6 m)   Wt 191 lb 6.4 oz (86.8 kg)   LMP 03/30/2023   SpO2 99%   BMI 33.90 kg/m    Physical Exam General: Alert, oriented Pulmonary: No respiratory distress Psych: Pleasant affect, good eye contact, spontaneous speech.  .   No results found for any visits on 04/08/23.      Assessment & Plan:   Vitamin D deficiency Assessment & Plan: Switching from weekly vitamin D 50K to daily over-the-counter vitamin D3.  Recommend 1000 or 2000 units daily.  Can recheck again in 1 year.   Subacute cough Assessment & Plan: Cough has resolved.  No further complaints.  No further workup.   Anxiety and depression Assessment & Plan: Patient feels much better on her current dose of Lexapro.  Patient does not wish to change dosing.  GAD score of 1.  At next refill will do 90-day supply.      Return in about 6 months (around 10/07/2023) for anxiety.    Sandre Kitty, MD

## 2023-04-08 NOTE — Assessment & Plan Note (Signed)
Cough has resolved.  No further complaints.  No further workup.

## 2023-05-29 ENCOUNTER — Other Ambulatory Visit: Payer: Self-pay | Admitting: Family Medicine

## 2023-10-07 ENCOUNTER — Encounter: Payer: Self-pay | Admitting: Family Medicine

## 2023-10-07 ENCOUNTER — Ambulatory Visit (INDEPENDENT_AMBULATORY_CARE_PROVIDER_SITE_OTHER): Payer: Medicaid Other | Admitting: Family Medicine

## 2023-10-07 VITALS — BP 155/106 | HR 86 | Ht 63.0 in | Wt 197.8 lb

## 2023-10-07 DIAGNOSIS — F419 Anxiety disorder, unspecified: Secondary | ICD-10-CM | POA: Diagnosis not present

## 2023-10-07 DIAGNOSIS — I10 Essential (primary) hypertension: Secondary | ICD-10-CM | POA: Diagnosis not present

## 2023-10-07 DIAGNOSIS — R052 Subacute cough: Secondary | ICD-10-CM | POA: Diagnosis not present

## 2023-10-07 DIAGNOSIS — M25472 Effusion, left ankle: Secondary | ICD-10-CM | POA: Diagnosis not present

## 2023-10-07 DIAGNOSIS — F32A Depression, unspecified: Secondary | ICD-10-CM

## 2023-10-07 HISTORY — DX: Essential (primary) hypertension: I10

## 2023-10-07 MED ORDER — VALSARTAN 40 MG PO TABS
40.0000 mg | ORAL_TABLET | Freq: Every day | ORAL | 2 refills | Status: DC
Start: 2023-10-07 — End: 2023-11-11

## 2023-10-07 MED ORDER — BENZONATATE 200 MG PO CAPS: 200.0000 mg | ORAL_CAPSULE | Freq: Two times a day (BID) | ORAL | 0 refills | Status: DC | PRN

## 2023-10-07 MED ORDER — BLISOVI FE 1/20 1-20 MG-MCG PO TABS: 1.0000 | ORAL_TABLET | Freq: Every day | ORAL | 11 refills | Status: DC

## 2023-10-07 MED ORDER — ESCITALOPRAM OXALATE 10 MG PO TABS: 10.0000 mg | ORAL_TABLET | Freq: Every day | ORAL | 1 refills | Status: DC

## 2023-10-07 NOTE — Progress Notes (Signed)
 Established Patient Office Visit  Subjective   Patient ID: Rebekah Fields, female    DOB: 1991/03/30  Age: 33 y.o. MRN: [id number]  Chief Complaint  Patient presents with   Medical Management of Chronic Issues    HPI  Anxiety-patient taking Lexapro 10 mg.  No concerns with this.  Would like to continue this medication.  Patient complaining of left ankle swelling with mild pain.  This first started 2 years ago, resolved for a long period time and then recently started recurring more frequently.  It usually occurs while she is exercising.  Goes away with ice and elevation.  The pain is mild compared to the swelling.  Does not recall ever injuring it.  Cough-patient had tested positive for flu a few weeks ago and still continues to have persistent cough.  She is taking Delsym but nothing else.  We discussed other medications, how long cough can linger after a respiratory infection.  Patient agreeable to trying Tessalon Perles.  Hypertension-patient has a family history of hypertension.  She has been told she has high blood pressure in the past.  Has never been on medication for this.  She has a history of kidney stones, she believes calcium oxalate stones.  We discussed medications for her.  She is agreeable to valsartan.   The ASCVD Risk score (Arnett DK, et al., 2019) failed to calculate for the following reasons:   The 2019 ASCVD risk score is only valid for ages 55 to 84  Health Maintenance Due  Topic Date Due   HIV Screening  Never done   Hepatitis C Screening  Never done   COVID-19 Vaccine (4 - 2024-25 season) 03/06/2023      Objective:     BP (!) 155/106   Pulse 86   Ht 5\' 3"  (1.6 m)   Wt 197 lb 12.8 oz (89.7 kg)   SpO2 100%   BMI 35.04 kg/m    Physical Exam General: Alert, oriented MSK: Mild soft tissue swelling in the left lateral ankle.  Minimal tenderness.  Normal range of motion.  Otherwise normal exam.   No results found for any visits on  10/07/23.      Assessment & Plan:   Subacute cough Assessment & Plan: Recently diagnosed with flu a few weeks ago.  Discussed symptomatic treatment with over-the-counter medications and offered Tessalon Perles.  If cough not improved at next month's visit consider further workup.   Anxiety and depression Assessment & Plan: Satisfied with current dose of Lexapro.  No changes.  Continue this medication.   Left ankle swelling Assessment & Plan: Similar issue occurred about 2 years ago resolved for a long period of time before recently returning.  Pain and tenderness is minimal, mostly swelling.  Unsure what is causing it since she has never had any injury or surgery to this area in the past.  Would not expect it to be arthritis given the minimal pain.  Possibly venous insufficiency?  Will hold off on further evaluation before I see her next month unless it worsens.   Primary hypertension Assessment & Plan: Starting valsartan.  Recheck at next visit.  Advised to keep blood pressure log at home and bring it with her at next visit.   Other orders -     Blisovi FE 1/20; Take 1 tablet by mouth daily.  Dispense: 28 tablet; Refill: 11 -     Valsartan; Take 1 tablet (40 mg total) by mouth daily.  Dispense: 30 tablet; Refill: 2 -  Escitalopram Oxalate; Take 1 tablet (10 mg total) by mouth daily.  Dispense: 90 tablet; Refill: 1 -     Benzonatate; Take 1 capsule (200 mg total) by mouth 2 (two) times daily as needed for cough.  Dispense: 20 capsule; Refill: 0     Return in about 4 weeks (around 11/04/2023) for HTN.    Sandre Kitty, MD

## 2023-10-07 NOTE — Assessment & Plan Note (Signed)
 Recently diagnosed with flu a few weeks ago.  Discussed symptomatic treatment with over-the-counter medications and offered Tessalon Perles.  If cough not improved at next month's visit consider further workup.

## 2023-10-07 NOTE — Assessment & Plan Note (Signed)
 Satisfied with current dose of Lexapro.  No changes.  Continue this medication.

## 2023-10-07 NOTE — Assessment & Plan Note (Signed)
 Starting valsartan.  Recheck at next visit.  Advised to keep blood pressure log at home and bring it with her at next visit.

## 2023-10-07 NOTE — Patient Instructions (Signed)
 It was nice to see you today,  We addressed the following topics today: -For your cough I have prescribed Tessalon Perles.  Use these twice a day as needed.  The over-the-counter cough medications are either going to contain guaifenesin or dextromethorphan.  Or a combination of both.  You can take these as well.  Your Delsym should have been dextromethorphan. - I have prescribed valsartan for your blood pressure.  Take this once a day. - I have refilled your Blisovi and your Lexapro.  Have a great day,  Frederic Jericho, MD

## 2023-10-07 NOTE — Assessment & Plan Note (Signed)
 Similar issue occurred about 2 years ago resolved for a long period of time before recently returning.  Pain and tenderness is minimal, mostly swelling.  Unsure what is causing it since she has never had any injury or surgery to this area in the past.  Would not expect it to be arthritis given the minimal pain.  Possibly venous insufficiency?  Will hold off on further evaluation before I see her next month unless it worsens.

## 2023-11-11 ENCOUNTER — Ambulatory Visit (INDEPENDENT_AMBULATORY_CARE_PROVIDER_SITE_OTHER): Admitting: Family Medicine

## 2023-11-11 ENCOUNTER — Encounter: Payer: Self-pay | Admitting: Family Medicine

## 2023-11-11 VITALS — BP 139/96 | HR 83 | Ht 63.0 in | Wt 196.9 lb

## 2023-11-11 DIAGNOSIS — F32A Depression, unspecified: Secondary | ICD-10-CM

## 2023-11-11 DIAGNOSIS — I10 Essential (primary) hypertension: Secondary | ICD-10-CM

## 2023-11-11 DIAGNOSIS — F419 Anxiety disorder, unspecified: Secondary | ICD-10-CM | POA: Diagnosis not present

## 2023-11-11 MED ORDER — VALSARTAN-HYDROCHLOROTHIAZIDE 80-12.5 MG PO TABS: 1.0000 | ORAL_TABLET | Freq: Every day | ORAL | 2 refills | Status: DC

## 2023-11-11 NOTE — Assessment & Plan Note (Signed)
-   Continue lexapro

## 2023-11-11 NOTE — Assessment & Plan Note (Signed)
 Bp better but still elevated.  Home readings are scanned to media tab.  Switching valsartan  to valsartan -hydrochlorothiazide 80-12.5.

## 2023-11-11 NOTE — Patient Instructions (Signed)
 It was nice to see you today,  We addressed the following topics today: -I am sending in a prescription for valsartan -HCTZ combination pill.  You will take this medicine instead of valsartan  by itself. - If you have trouble with the medication whether it is too expensive or you are unable to get it let us  know and I will send the hydrochlorothiazide in separately and you will take your valsartan  and hydrochlorothiazide separately. - In about 6 weeks we will come back for your physical and after that we can do yearly visits if your blood pressure is controlled.  Have a great day,  Etha Henle, MD

## 2023-11-11 NOTE — Progress Notes (Signed)
   Established Patient Office Visit  Subjective   Patient ID: Rebekah Fields, female    DOB: Sep 25, 1990  Age: 33 y.o. MRN: [id number]  Chief Complaint  Patient presents with   Hypertension    HPI  Subjective - Hypertension: no issues with valsartan . Blood pressure readings still slightly elevated but improved from initial values. Bottom number closer to 90 than desired 80. Top number improving, goal is 130 or better. - Ankle swelling: Reports swelling occurred once or twice but otherwise fine. - Cough: Reports cough has resolved.  Medications: Valsartan  (hypertension), Lexapro  (no issues reported), blisovi   PMH, PSH, FH, Social Hx: Hypertension, ankle swelling, cough (resolved)  ROS: Denies new concerns or issues. Reports ankle swelling occurred once or twice but otherwise fine. Reports cough has resolved.  The ASCVD Risk score (Arnett DK, et al., 2019) failed to calculate for the following reasons:   The 2019 ASCVD risk score is only valid for ages 14 to 63  Health Maintenance Due  Topic Date Due   HIV Screening  Never done   Hepatitis C Screening  Never done   COVID-19 Vaccine (4 - 2024-25 season) 03/06/2023      Objective:     BP (!) 139/96   Pulse 83   Ht 5\' 3"  (1.6 m)   Wt 196 lb 14.4 oz (89.3 kg)   LMP 11/09/2023   SpO2 98%   BMI 34.88 kg/m    Physical Exam General: Alert, oriented Pulmonary: No respiratory stress Psych: Pleasant affect.   No results found for any visits on 11/11/23.      Assessment & Plan:   Primary hypertension Assessment & Plan: Bp better but still elevated.  Home readings are scanned to media tab.  Switching valsartan  to valsartan -hydrochlorothiazide 80-12.5.     Anxiety and depression Assessment & Plan: Continue lexapro .    Other orders -     Valsartan -hydroCHLOROthiazide; Take 1 tablet by mouth daily.  Dispense: 30 tablet; Refill: 2     Return in about 6 weeks (around 12/23/2023) for physical.    Laneta Pintos, MD

## 2023-12-05 ENCOUNTER — Encounter (HOSPITAL_COMMUNITY): Payer: Self-pay

## 2023-12-05 ENCOUNTER — Emergency Department (HOSPITAL_COMMUNITY)
Admission: EM | Admit: 2023-12-05 | Discharge: 2023-12-05 | Disposition: A | Discharge status: Home / Self Care | Attending: Emergency Medicine | Admitting: Emergency Medicine

## 2023-12-05 ENCOUNTER — Other Ambulatory Visit: Payer: Self-pay

## 2023-12-05 DIAGNOSIS — R112 Nausea with vomiting, unspecified: Secondary | ICD-10-CM

## 2023-12-05 DIAGNOSIS — A020 Salmonella enteritis: Secondary | ICD-10-CM | POA: Diagnosis not present

## 2023-12-05 DIAGNOSIS — R109 Unspecified abdominal pain: Secondary | ICD-10-CM | POA: Diagnosis present

## 2023-12-05 LAB — URINALYSIS, ROUTINE W REFLEX MICROSCOPIC
Bacteria, UA: NONE SEEN
Bilirubin Urine: NEGATIVE
Glucose, UA: NEGATIVE mg/dL
Hgb urine dipstick: NEGATIVE
Ketones, ur: 20 mg/dL — AB
Nitrite: NEGATIVE
Protein, ur: NEGATIVE mg/dL
Specific Gravity, Urine: 1.018 (ref 1.005–1.030)
pH: 6 (ref 5.0–8.0)

## 2023-12-05 LAB — CBC
HCT: 40.8 % (ref 36.0–46.0)
Hemoglobin: 13.3 g/dL (ref 12.0–15.0)
MCH: 28.4 pg (ref 26.0–34.0)
MCHC: 32.6 g/dL (ref 30.0–36.0)
MCV: 87 fL (ref 80.0–100.0)
Platelets: 374 10*3/uL (ref 150–400)
RBC: 4.69 MIL/uL (ref 3.87–5.11)
RDW: 12.3 % (ref 11.5–15.5)
WBC: 9.6 10*3/uL (ref 4.0–10.5)
nRBC: 0 % (ref 0.0–0.2)

## 2023-12-05 LAB — COMPREHENSIVE METABOLIC PANEL WITH GFR
ALT: 21 U/L (ref 0–44)
AST: 25 U/L (ref 15–41)
Albumin: 3.7 g/dL (ref 3.5–5.0)
Alkaline Phosphatase: 43 U/L (ref 38–126)
Anion gap: 9 (ref 5–15)
BUN: 9 mg/dL (ref 6–20)
CO2: 22 mmol/L (ref 22–32)
Calcium: 9.2 mg/dL (ref 8.9–10.3)
Chloride: 104 mmol/L (ref 98–111)
Creatinine, Ser: 0.77 mg/dL (ref 0.44–1.00)
GFR, Estimated: >60 mL/min (ref >60)
Glucose, Bld: 134 mg/dL — ABNORMAL HIGH (ref 70–99)
Potassium: 3.9 mmol/L (ref 3.5–5.1)
Sodium: 135 mmol/L (ref 135–145)
Total Bilirubin: 0.6 mg/dL (ref 0.0–1.2)
Total Protein: 8.2 g/dL — ABNORMAL HIGH (ref 6.5–8.1)

## 2023-12-05 LAB — HCG, SERUM, QUALITATIVE: Preg, Serum: NEGATIVE

## 2023-12-05 LAB — LIPASE, BLOOD: Lipase: 51 U/L (ref 11–51)

## 2023-12-05 MED ORDER — PROMETHAZINE HCL 25 MG RE SUPP: 25.0000 mg | Freq: Four times a day (QID) | RECTAL | 0 refills | Status: DC | PRN

## 2023-12-05 MED ORDER — MORPHINE SULFATE (PF) 4 MG/ML IV SOLN
4.0000 mg | Freq: Once | INTRAVENOUS | Status: AC
  Administered 2023-12-05: 4 mg via INTRAVENOUS
  Filled 2023-12-05: qty 1

## 2023-12-05 MED ORDER — ONDANSETRON 8 MG PO TBDP
8.0000 mg | ORAL_TABLET | Freq: Three times a day (TID) | ORAL | 0 refills | Status: DC | PRN
Start: 2023-12-05 — End: 2024-03-16

## 2023-12-05 MED ORDER — CIPROFLOXACIN HCL 500 MG PO TABS: 500.0000 mg | ORAL_TABLET | Freq: Two times a day (BID) | ORAL | 0 refills | Status: DC

## 2023-12-05 MED ORDER — ONDANSETRON HCL 4 MG/2ML IJ SOLN
4.0000 mg | Freq: Once | INTRAMUSCULAR | Status: AC
  Administered 2023-12-05: 4 mg via INTRAVENOUS
  Filled 2023-12-05: qty 2

## 2023-12-05 MED ORDER — LOPERAMIDE HCL 2 MG PO CAPS: 4.0000 mg | ORAL_CAPSULE | Freq: Every evening | ORAL | 0 refills | Status: DC | PRN

## 2023-12-05 MED ORDER — LACTATED RINGERS IV BOLUS
1000.0000 mL | Freq: Once | INTRAVENOUS | Status: AC
  Administered 2023-12-05: 1000 mL via INTRAVENOUS

## 2023-12-05 NOTE — ED Provider Notes (Signed)
 [place] [facility]PARTMENT AT [facility] Provider Note   CSN: [id number] Arrival date & time: 12/05/23  0920     History  Chief Complaint  Patient presents with   Abdominal Pain   Nausea   Emesis   Diarrhea    Rebekah Fields is a 33 y.o. female.  HPI    33 year old female comes in with chief complaint of abdominal pain, nausea, vomiting and diarrhea. Patient states that she was feeling well last night when she went to bed.  She woke up in the middle the night with abdominal pain, nausea, vomiting and diarrhea.  Subsequently she has had more than 10 episodes of emesis and diarrhea.  Her emesis now is bilious.  Diarrhea is loose, watery.  No blood in the stool.  Patient states that she had cooked a meal yesterday that had cucumbers in it.  She knows now that there was a recall on cucumbers.  No other sick contacts.  Her pain comes in waves.  Typically after the pain she will have vomiting or diarrhea.  Review of system positive for dizziness and lightheadedness, which prompted her to come to the ER.  Home Medications Prior to Admission medications   Medication Sig Start Date End Date Taking? Authorizing Provider  ciprofloxacin  (CIPRO ) 500 MG tablet Take 1 tablet (500 mg total) by mouth every 12 (twelve) hours. 12/05/23  Yes Deatra Face, MD  loperamide (IMODIUM) 2 MG capsule Take 2 capsules (4 mg total) by mouth at bedtime and may repeat dose one time if needed. 12/05/23  Yes Deatra Face, MD  ondansetron  (ZOFRAN -ODT) 8 MG disintegrating tablet Take 1 tablet (8 mg total) by mouth every 8 (eight) hours as needed for nausea. 12/05/23  Yes Deatra Face, MD  promethazine (PHENERGAN) 25 MG suppository Place 1 suppository (25 mg total) rectally every 6 (six) hours as needed for nausea or vomiting. 12/05/23  Yes Deatra Face, MD  benzonatate  (TESSALON ) 200 MG capsule Take 1 capsule (200 mg total) by mouth 2 (two) times daily as needed for cough. 10/07/23   Laneta Pintos, MD  BLISOVI  FE 1/20 1-20 MG-MCG tablet Take 1 tablet by mouth daily. 10/07/23   Laneta Pintos, MD  escitalopram  (LEXAPRO ) 10 MG tablet Take 1 tablet (10 mg total) by mouth daily. 10/07/23   Laneta Pintos, MD  valsartan -hydrochlorothiazide  (DIOVAN -HCT) 80-12.5 MG tablet Take 1 tablet by mouth daily. 11/11/23   Laneta Pintos, MD      Allergies    Sulfa antibiotics, Lactose intolerance (gi), and Nickel    Review of Systems   Review of Systems  All other systems reviewed and are negative.   Physical Exam Updated Vital Signs BP (!) 138/94 (BP Location: Left Arm)   Pulse 91   Temp 98.6 F (37 C) (Oral)   Resp 16   LMP 11/09/2023   SpO2 100%  Physical Exam Vitals and nursing note reviewed.  Constitutional:      Appearance: She is well-developed.  HENT:     Head: Atraumatic.  Cardiovascular:     Rate and Rhythm: Normal rate.  Pulmonary:     Effort: Pulmonary effort is normal.  Abdominal:     Palpations: Abdomen is soft.     Tenderness: There is generalized abdominal tenderness. There is no guarding or rebound.  Musculoskeletal:     Cervical back: Normal range of motion and neck supple.  Skin:    General: Skin is warm and dry.  Neurological:  Mental Status: She is alert and oriented to person, place, and time.     ED Results / Procedures / Treatments   Labs (all labs ordered are listed, but only abnormal results are displayed) Labs Reviewed  COMPREHENSIVE METABOLIC PANEL WITH GFR - Abnormal; Notable for the following components:      Result Value   Glucose, Bld 134 (*)    Total Protein 8.2 (*)    All other components within normal limits  URINALYSIS, ROUTINE W REFLEX MICROSCOPIC - Abnormal; Notable for the following components:   APPearance HAZY (*)    Ketones, ur 20 (*)    Leukocytes,Ua TRACE (*)    All other components within normal limits  LIPASE, BLOOD  CBC  HCG, SERUM, QUALITATIVE    EKG None  Radiology No results  found.  Procedures Procedures    Medications Ordered in ED Medications  lactated ringers  bolus 1,000 mL (1,000 mLs Intravenous New Bag/Given 12/05/23 1157)  ondansetron  (ZOFRAN ) injection 4 mg (4 mg Intravenous Given 12/05/23 1156)  morphine  (PF) 4 MG/ML injection 4 mg (4 mg Intravenous Given 12/05/23 1156)    ED Course/ Medical Decision Making/ A&P                                 Medical Decision Making Amount and/or Complexity of Data Reviewed Labs: ordered.  Risk Prescription drug management.    Final Clinical Impression(s) / ED Diagnoses Final diagnoses:  Salmonella gastroenteritis  Nausea vomiting and diarrhea   33 year old female comes in with chief complaint of nausea, vomiting, diarrhea, abdominal pain starting last night.  She is otherwise healthy.  No abdominal surgical history.  No fevers, chills, bloody stools. Patient does indicate that she had some cucumbers for meal yesterday, and [place]  currently under cucumber recall because of Salmonella outbreak.  Medically it appears that patient is having gastroenteritis, my suspicion is that it is most likely related to the Salmonella from cucumber intake in this case.  Differential diagnosis considered for this patient includes gastroenteritis, colitis, dehydration, electrolyte abnormality, renal failure.  We have ordered basic labs to ensure electrolytes are normal.  Will give patient IV fluid.  Abdominal exam reassuring.  Pain comes in waves, likely related to spasms.  Reassessment: Patient feels a lot better. Will discharge.  Antibiotics provided as wait and watch approach.  Strict ER return precautions have been discussed, and patient is agreeing with the plan and is comfortable with the workup done and the recommendations from the ER.   Rx / DC Orders ED Discharge Orders          Ordered    ciprofloxacin  (CIPRO ) 500 MG tablet  Every 12 hours        12/05/23 1325    ondansetron  (ZOFRAN -ODT) 8 MG  disintegrating tablet  Every 8 hours PRN        12/05/23 1325    promethazine (PHENERGAN) 25 MG suppository  Every 6 hours PRN        12/05/23 1325    loperamide (IMODIUM) 2 MG capsule  at bedtime and repeat x1 PRN        12/05/23 1325              Deatra Face, MD 12/05/23 1328

## 2023-12-05 NOTE — Discharge Instructions (Addendum)
 We suspect that you might have Salmonella gastroenteritis.  Hydration is very important.  Take the nausea medication, pain medications that are prescribed. Loperamide is your diarrhea medicine, take the medicine for nighttime symptoms only.  Antibiotics prescribed, however take the antibiotics only if your symptoms are getting worse (bloody stools, fevers etc.), or symptoms are not getting better 3 days into the illness.

## 2023-12-05 NOTE — ED Triage Notes (Signed)
 PT arrives via POV. Pt reports lower abdominal pain, nausea, vomiting, and diarrhea since this morning. Pt AxOx4. She states she is concerned she has food poisoning.

## 2023-12-22 ENCOUNTER — Other Ambulatory Visit: Payer: Self-pay | Admitting: *Deleted

## 2023-12-22 DIAGNOSIS — Z131 Encounter for screening for diabetes mellitus: Secondary | ICD-10-CM

## 2023-12-22 DIAGNOSIS — I10 Essential (primary) hypertension: Secondary | ICD-10-CM

## 2023-12-22 DIAGNOSIS — E559 Vitamin D deficiency, unspecified: Secondary | ICD-10-CM

## 2023-12-27 ENCOUNTER — Other Ambulatory Visit

## 2023-12-27 DIAGNOSIS — I10 Essential (primary) hypertension: Secondary | ICD-10-CM

## 2023-12-27 DIAGNOSIS — Z131 Encounter for screening for diabetes mellitus: Secondary | ICD-10-CM

## 2023-12-27 DIAGNOSIS — E559 Vitamin D deficiency, unspecified: Secondary | ICD-10-CM

## 2024-01-03 ENCOUNTER — Encounter: Admitting: Family Medicine

## 2024-03-16 ENCOUNTER — Encounter: Payer: Self-pay | Admitting: Podiatry

## 2024-03-16 ENCOUNTER — Ambulatory Visit

## 2024-03-16 ENCOUNTER — Ambulatory Visit (INDEPENDENT_AMBULATORY_CARE_PROVIDER_SITE_OTHER): Payer: No Typology Code available for payment source | Admitting: Podiatry

## 2024-03-16 VITALS — Ht 63.0 in | Wt 201.0 lb

## 2024-03-16 DIAGNOSIS — M7751 Other enthesopathy of right foot: Secondary | ICD-10-CM

## 2024-03-16 DIAGNOSIS — M778 Other enthesopathies, not elsewhere classified: Secondary | ICD-10-CM | POA: Diagnosis not present

## 2024-03-16 DIAGNOSIS — M76821 Posterior tibial tendinitis, right leg: Secondary | ICD-10-CM | POA: Diagnosis not present

## 2024-03-16 NOTE — Progress Notes (Signed)
 Chief Complaint  Patient presents with   Foot Pain    R foot pain Mid foot plantar and dorsal x 2 weeks.  Pain 3 swelling worse as day goes on.  Iced and elevated wearing compression sleeve.  Non Diabetic.  No anti coag    HPI: 33 y.o. female presents today with right foot and ankle pain.  Locates most of the pain to the right dorsal midfoot and right medial midfoot.  Has been going on for at least 2 weeks.  She endorses soreness, throbbing, aching pain as the day goes on.  She has tried using some home compression, icing and elevation.  Denies specific injury.  Past Medical History:  Diagnosis Date   Allergy    Anxiety    Depression    HTN (hypertension) 10/07/2023   Kidney stones    PCOS (polycystic ovarian syndrome)     Past Surgical History:  Procedure Laterality Date   APPENDECTOMY      Allergies  Allergen Reactions   Sulfa Antibiotics Shortness Of Breath, Swelling and Other (See Comments)    SWELLING/EDEMA of the limbs   Lactose Intolerance (Gi) Diarrhea   Nickel Itching    ROS    Physical Exam: There were no vitals filed for this visit.  General: The patient is alert and oriented x3 in no acute distress.  Dermatology: Skin is warm, dry and supple bilateral lower extremities. Interspaces are clear of maceration and debris.    Vascular: Palpable pedal pulses bilaterally. Capillary refill within normal limits.  No appreciable edema.  No erythema or calor.  Neurological: Light touch sensation grossly intact bilateral feet.   Musculoskeletal Exam: Muscle strength 5/5 all major muscle groups.  Right side there is some guarding present secondary due to pain in dorsiflexion and inversion in particular.  Pes planus foot type.  Tenderness on palpation of right dorsal midfoot along extensor tendons and a PT tendon at level of insertion.  Radiographic Exam: Right foot 3 views weightbearing 03/16/2024 Normal osseous mineralization. Joint spaces preserved.  No fractures  or acute osseous irregularities noted.  Pes planus foot type noted with decreased calcaneal inclination angle, increased talar declination angle.  Mild to moderate bunion deformity present.  Forefoot edema present.  Assessment/Plan of Care: 1. Posterior tibial tendon dysfunction (PTTD) of right lower extremity   2. Extensor tendonitis of foot      No orders of the defined types were placed in this encounter.  None  Discussed clinical findings with patient today.  Radiographs reviewed with patient.  Discussed the etiology and treatment options for right foot extensor EHL and TA tendinitis.  Discussed that this is in the setting of pes planus with PTTD.  We discussed that these types of injuries are overuse injuries and respond well to anti-inflammatories, rest, immobilization.  I reviewed RICE protocol with the patient.  Recommend the following treatment plan:  -Immobilization with Cam boot -Oral anti-inflammatories prescribed: Unable to take OTC NSAIDs and is diabetic therefore we will avoid p.o. corticosteroids.  Recommend that patient take 1000 mg Tylenol  2 times a day as needed for next 1 to 2 weeks -Recommended topical diclofenac  gel 1% to apply daily 3-4 times on the painful areas -Recommended twice daily stretching through these exercises reviewed with the patient and an informational handout was given to the patient.  If doing well, may transition to quality over-the-counter insert.    Kambrey Hagger L. Lamount MAUL, AACFAS [facility]  [address] [place], [place] [address]                Office [phone]  Fax [phone]

## 2024-03-16 NOTE — Patient Instructions (Signed)
 Type in this link for rehab program for extensor tendinitis right foot  [email or web address]  In addition to the exercises and these 2 packets, recommend massaging sore areas with over-the-counter Voltaren  gel 3-4 times a day for several minutes at a time.  Can continue with icing, compression bracing and elevation.  Try and utilize the walking boot over the next 2 to 3 weeks  Look for an EvenUp shoe attachment on [employer] or at [employer]. This will level out your hips while you are walking in the CAM boot. Wear this on the other foot around a supportive sneaker:

## 2024-03-29 ENCOUNTER — Other Ambulatory Visit: Payer: Self-pay | Admitting: Family Medicine

## 2024-04-20 ENCOUNTER — Ambulatory Visit: Admitting: Podiatry

## 2024-05-04 ENCOUNTER — Encounter: Payer: Self-pay | Admitting: Podiatry

## 2024-05-04 ENCOUNTER — Ambulatory Visit: Admitting: Podiatry

## 2024-05-04 DIAGNOSIS — M778 Other enthesopathies, not elsewhere classified: Secondary | ICD-10-CM | POA: Diagnosis not present

## 2024-05-04 DIAGNOSIS — M76821 Posterior tibial tendinitis, right leg: Secondary | ICD-10-CM | POA: Diagnosis not present

## 2024-05-04 NOTE — Progress Notes (Signed)
  Subjective:  Patient ID: Rebekah Fields, female    DOB: 09/11/1990,  MRN: [id number]  Chief Complaint  Patient presents with   Follow-up    Extensor tendinitis/pttd. 85% better. 0 pain. Wore cam walker.     Discussed the use of AI scribe software for clinical note transcription with the patient, who gave verbal consent to proceed.  History of Present Illness Rebekah Fields is a 33 year old female who presents for follow-up of right foot pain.  Her right foot pain has significantly improved since the last visit. The use of a boot has alleviated extensor tendon pain on the top of her foot and arch pain. After removing the boot, symptoms recurred for less than five days, leading her to wear it again briefly. No swelling has occurred since removing the boot.  She occasionally experiences morning discomfort, described as feeling the bone in her foot, which resolves after a few minutes. Stretching exercises and rolling a frozen water bottle under her foot effectively alleviate symptoms. She has purchased [employer] with built-in support, which she finds beneficial.      Objective:    Physical Exam MUSCULOSKELETAL: Mild pain on palpation of the right foot plantar midfoot. No pain on palpation of the posterior right foot or PT tendon. No pain on palpation of anterior ankle tendons. No pain with resisted plantarflexion, dorsiflexion, inversion, or eversion.  Muscle strength 5/5 for all major muscle groups.  Pes planus foot type noted. DP and PT pulses palpable 2/4.  Cap refill brisk to the digits. Light touch and protective sensation intact bilaterally. Pedal skin is warm, dry and supple bilateral lower extremities.   No images are attached to the encounter.    Results Radiographic Exam: Right foot 3 views weightbearing 03/16/2024 Normal osseous mineralization. Joint spaces preserved.  No fractures or acute osseous irregularities noted.  Pes planus foot type noted with decreased  calcaneal inclination angle, increased talar declination angle.  Mild to moderate bunion deformity present.  Forefoot edema present.   Assessment:   1. Posterior tibial tendon dysfunction (PTTD) of right lower extremity   2. Extensor tendonitis of foot      Plan:  Patient was evaluated and treated and all questions answered.  Assessment and Plan Assessment & Plan Right posterior tibial tendon dysfunction Significant improvement with boot use. No current pain. Focus on preventing recurrence due to flat foot and hypermobility. - Discontinue boot use. - Recommend supportive sneakers with thick midsole and higher heel, e.g., [place], Hokas, Asics, New Balance, Ultras. - Consider Clarks dress shoes for formal occasions. - Recommend over-the-counter inserts like Power Steps or Super Feet. - Offer Power Steps inserts for purchase in office for pes planus foot type and limit recurrence of PTTD - Advise replacing old insole with orthotic, break-in period 30 minutes to 1 hour daily. - Provide stretching exercises for PT tendon dysfunction and plantar fasciitis in after-visit summary. - Advise using frozen water bottle for foot massage if pain occurs. - Instruct to return if pain persists or worsens.  Right extensor tendonitis Improvement with boot use. No current pain. Potential flare-ups due to compensation and activity. - Recommend stretching exercises and home rehab for front of ankle pain. - Advise using frozen water bottle for foot massage if pain occurs. - Instruct to return if pain persists or worsens.      Return if symptoms worsen or fail to improve.

## 2024-05-04 NOTE — Patient Instructions (Signed)

## 2024-06-22 ENCOUNTER — Other Ambulatory Visit: Payer: Self-pay | Admitting: Family Medicine

## 2024-07-10 ENCOUNTER — Emergency Department

## 2024-07-10 ENCOUNTER — Encounter: Payer: Self-pay | Admitting: Emergency Medicine

## 2024-07-10 ENCOUNTER — Other Ambulatory Visit: Payer: Self-pay

## 2024-07-10 DIAGNOSIS — I10 Essential (primary) hypertension: Secondary | ICD-10-CM | POA: Diagnosis not present

## 2024-07-10 DIAGNOSIS — F439 Reaction to severe stress, unspecified: Secondary | ICD-10-CM | POA: Diagnosis not present

## 2024-07-10 DIAGNOSIS — R0789 Other chest pain: Secondary | ICD-10-CM | POA: Diagnosis present

## 2024-07-10 LAB — BASIC METABOLIC PANEL WITH GFR
Anion gap: 14 (ref 5–15)
BUN: 8 mg/dL (ref 6–20)
CO2: 24 mmol/L (ref 22–32)
Calcium: 9.8 mg/dL (ref 8.9–10.3)
Chloride: 98 mmol/L (ref 98–111)
Creatinine, Ser: 0.73 mg/dL (ref 0.44–1.00)
GFR, Estimated: >60 mL/min
Glucose, Bld: 114 mg/dL — ABNORMAL HIGH (ref 70–99)
Potassium: 4.2 mmol/L (ref 3.5–5.1)
Sodium: 136 mmol/L (ref 135–145)

## 2024-07-10 LAB — TROPONIN T, HIGH SENSITIVITY: Troponin T High Sensitivity: <15 ng/L (ref 0–19)

## 2024-07-10 LAB — POC URINE PREG, ED: Preg Test, Ur: NEGATIVE

## 2024-07-10 LAB — CBC
HCT: 40.4 % (ref 36.0–46.0)
Hemoglobin: 13.4 g/dL (ref 12.0–15.0)
MCH: 28.8 pg (ref 26.0–34.0)
MCHC: 33.2 g/dL (ref 30.0–36.0)
MCV: 86.9 fL (ref 80.0–100.0)
Platelets: 402 K/uL — ABNORMAL HIGH (ref 150–400)
RBC: 4.65 MIL/uL (ref 3.87–5.11)
RDW: 12.4 % (ref 11.5–15.5)
WBC: 12.9 K/uL — ABNORMAL HIGH (ref 4.0–10.5)
nRBC: 0 % (ref 0.0–0.2)

## 2024-07-10 NOTE — ED Triage Notes (Signed)
 Pt arrives POV, ambulatory to triage, gait steady, no acute distress noted c/o chest tightness w/ sob x couple hours. Pt reports radiation to right arm, which she has never experienced before w/ her anxiety. Pt reports increased anxiety all day today.

## 2024-07-10 NOTE — ED Notes (Signed)
 Urine POC preg is NEGATIVE

## 2024-07-11 ENCOUNTER — Emergency Department
Admission: EM | Admit: 2024-07-11 | Discharge: 2024-07-11 | Disposition: A | Discharge status: Home / Self Care | Attending: Emergency Medicine | Admitting: Emergency Medicine

## 2024-07-11 DIAGNOSIS — R079 Chest pain, unspecified: Secondary | ICD-10-CM

## 2024-07-11 LAB — TROPONIN T, HIGH SENSITIVITY: Troponin T High Sensitivity: <15 ng/L (ref 0–19)

## 2024-07-11 MED ORDER — HYDROXYZINE HCL 25 MG PO TABS: 25.0000 mg | ORAL_TABLET | Freq: Three times a day (TID) | ORAL | 0 refills | Status: AC | PRN

## 2024-07-11 NOTE — ED Provider Notes (Signed)
 "  [facility] Provider Note    Event Date/Time   First MD Initiated Contact with Patient 07/11/24 0032     (approximate)   History   Chief Complaint Chest Pain   HPI  Rebekah Fields is a 34 y.o. female with past medical history of hypertension, PCOS, and anxiety who presents to the ED complaining of chest pain.  Patient reports that she began experiencing tightness in her chest about 2 hours prior to arrival.  She states that she has had a stressful day today and is concerned that the chest discomfort could be related to that.  Pain does not seem to be exacerbated or alleviated by anything in particular and she denies any fevers, cough, or shortness of breath.  She has not had any pain or swelling in her legs.     Physical Exam   Triage Vital Signs: ED Triage Vitals  Encounter Vitals Group     BP 07/10/24 2228 (!) 145/114     Girls Systolic BP Percentile --      Girls Diastolic BP Percentile --      Boys Systolic BP Percentile --      Boys Diastolic BP Percentile --      Pulse Rate 07/10/24 2228 (!) 102     Resp 07/10/24 2228 18     Temp 07/10/24 2228 98.9 F (37.2 C)     Temp Source 07/10/24 2228 Oral     SpO2 07/10/24 2228 98 %     Weight 07/10/24 2229 201 lb (91.2 kg)     Height 07/10/24 2229 5' 3 (1.6 m)     Head Circumference --      Peak Flow --      Pain Score 07/10/24 2228 8     Pain Loc --      Pain Education --      Exclude from Growth Chart --     Most recent vital signs: Vitals:   07/11/24 0300 07/11/24 0330  BP: (!) 134/92 (!) 138/105  Pulse: (!) 102 98  Resp:    Temp:    SpO2: 100% 100%    Constitutional: Alert and oriented. Eyes: Conjunctivae are normal. Head: Atraumatic. Nose: No congestion/rhinnorhea. Mouth/Throat: Mucous membranes are moist.  Cardiovascular: Normal rate, regular rhythm. Grossly normal heart sounds.  2+ radial pulses bilaterally. Respiratory: Normal respiratory effort.  No retractions. Lungs  CTAB. Gastrointestinal: Soft and nontender. No distention. Musculoskeletal: No lower extremity tenderness nor edema.  Neurologic:  Normal speech and language. No gross focal neurologic deficits are appreciated.    ED Results / Procedures / Treatments   Labs (all labs ordered are listed, but only abnormal results are displayed) Labs Reviewed  BASIC METABOLIC PANEL WITH GFR - Abnormal; Notable for the following components:      Result Value   Glucose, Bld 114 (*)    All other components within normal limits  CBC - Abnormal; Notable for the following components:   WBC 12.9 (*)    Platelets 402 (*)    All other components within normal limits  POC URINE PREG, ED  TROPONIN T, HIGH SENSITIVITY  TROPONIN T, HIGH SENSITIVITY     EKG  ED ECG REPORT I, Carlin Palin, the attending physician, personally viewed and interpreted this ECG.   Date: 07/11/2024  EKG Time: 22:26  Rate: 101  Rhythm: sinus tachycardia  Axis: Normal  Intervals:none  ST&T Change: None  RADIOLOGY Chest x-ray reviewed and interpreted by me with no infiltrate,  edema, or effusion.  PROCEDURES:  Critical Care performed: No  Procedures   MEDICATIONS ORDERED IN ED: Medications - No data to display   IMPRESSION / MDM / ASSESSMENT AND PLAN / ED COURSE  I reviewed the triage vital signs and the nursing notes.                              34 y.o. female with past medical history of hypertension, anxiety, and PCOS who presents to the ED complaining of chest tightness for the past 2 hours.  Patient's presentation is most consistent with acute presentation with potential threat to life or bodily function.  Differential diagnosis includes, but is not limited to, ACS, PE, pneumonia, pneumothorax, musculoskeletal pain, GERD, anxiety.  Patient well-appearing and in no acute distress, vital signs are unremarkable.  EKG shows no evidence of arrhythmia or ischemia and 2 sets of troponin are negative.  Chest  x-ray is unremarkable and additional labs without significant anemia, leukocytosis, electrolyte abnormality, or AKI.  Pregnancy testing is negative and patient reports that symptoms are resolving, low suspicion for PE.  She is appropriate for discharge home and I suspect anxiety may be playing a role in her chest tightness.  She was counseled to return to the ED for new or worsening symptoms, patient and family agree with plan.      FINAL CLINICAL IMPRESSION(S) / ED DIAGNOSES   Final diagnoses:  Nonspecific chest pain     Rx / DC Orders   ED Discharge Orders          Ordered    hydrOXYzine  (ATARAX ) 25 MG tablet  3 times daily PRN        07/11/24 0349             Note:  This document was prepared using Dragon voice recognition software and may include unintentional dictation errors.   Willo Dunnings, MD 07/11/24 [phone]  "

## 2024-07-13 ENCOUNTER — Ambulatory Visit: Admitting: Family Medicine

## 2024-07-13 ENCOUNTER — Encounter: Payer: Self-pay | Admitting: Family Medicine

## 2024-07-13 VITALS — BP 123/88 | HR 90 | Ht 63.0 in | Wt 201.4 lb

## 2024-07-13 DIAGNOSIS — F32A Depression, unspecified: Secondary | ICD-10-CM

## 2024-07-13 DIAGNOSIS — I10 Essential (primary) hypertension: Secondary | ICD-10-CM

## 2024-07-13 DIAGNOSIS — F419 Anxiety disorder, unspecified: Secondary | ICD-10-CM | POA: Diagnosis not present

## 2024-07-13 DIAGNOSIS — Z0000 Encounter for general adult medical examination without abnormal findings: Secondary | ICD-10-CM

## 2024-07-13 DIAGNOSIS — E559 Vitamin D deficiency, unspecified: Secondary | ICD-10-CM | POA: Diagnosis not present

## 2024-07-13 DIAGNOSIS — Z23 Encounter for immunization: Secondary | ICD-10-CM | POA: Diagnosis not present

## 2024-07-13 DIAGNOSIS — M25562 Pain in left knee: Secondary | ICD-10-CM

## 2024-07-13 DIAGNOSIS — Z131 Encounter for screening for diabetes mellitus: Secondary | ICD-10-CM

## 2024-07-13 MED ORDER — ESCITALOPRAM OXALATE 10 MG PO TABS: 10.0000 mg | ORAL_TABLET | Freq: Every day | ORAL | 0 refills | Status: AC

## 2024-07-13 MED ORDER — BLISOVI FE 1/20 1-20 MG-MCG PO TABS: 1.0000 | ORAL_TABLET | Freq: Every day | ORAL | 11 refills | Status: AC

## 2024-07-13 MED ORDER — VALSARTAN-HYDROCHLOROTHIAZIDE 80-12.5 MG PO TABS: 1.0000 | ORAL_TABLET | Freq: Every day | ORAL | 1 refills | Status: AC

## 2024-07-13 NOTE — Patient Instructions (Signed)
" °  VISIT SUMMARY: During your visit, we discussed your recent episode of chest pain and anxiety, knee pain, and your hypertension management. We also reviewed your general health maintenance and administered a flu vaccine.  YOUR PLAN: ou have left knee pain and swelling likely due to tendinitis, which is worsened by certain movements. -Continue using the knee brace as needed. -Use pain relievers, topicals, heat, and ice to manage symptoms. -Schedule an appointment if symptoms recur for further evaluation.  HYPERTENSION: Your hypertension is well-controlled with your current medications. -Continue taking valsartan  and hydrochlorothiazide  as prescribed.  ANXIETY DISORDER: You experienced recent anxiety-induced chest pain, likely related to stress. Hydroxyzine  has been prescribed, but it causes dry mouth. -Continue taking hydroxyzine  as needed for anxiety. -Consider propranolol for situational anxiety if hydroxyzine  is not well tolerated.  GENERAL HEALTH MAINTENANCE: Routine health maintenance was discussed. -You received a flu vaccine. -Lab work was ordered for your physical examination. -A follow-up appointment is scheduled in one year for your physical examination.    "

## 2024-07-13 NOTE — Progress Notes (Unsigned)
 "  Established Patient Office Visit  Subjective   Patient ID: Rebekah Fields, female    DOB: [birth year]  Age: 34 y.o. MRN: [id number]  Chief Complaint  Patient presents with   Medical Management of Chronic Issues    Discussed the use of AI scribe software for clinical note transcription with the patient, who gave verbal consent to proceed.  History of Present Illness   Rebekah Fields is a 34 year old female with hypertension who presents with a recent episode of chest pain and anxiety.  She experienced chest pain radiating to her arm, prompting a visit to the emergency department. This was a new symptom, and she was concerned due to the radiation of the pain. At the emergency department, she was monitored, had blood tests, and was prescribed hydroxyzine  for anxiety. She has been using hydroxyzine  since then, which has helped with her anxiety, though it causes dry mouth as a side effect. This was the worst anxiety episode she has had in eight years, with her blood pressure recorded at 145/114 during the episode. Since then, she has been feeling better.  She is currently on valsartan  and hydrochlorothiazide  for hypertension, which she reports is working well. She does not check her blood pressure daily at home, but reports that it has not been super high. She is also on Lexapro  without any issues.  She recently experienced an irregular menstrual cycle while on Blisovi , which she attributes to inconsistent medication use. This resulted in a heavier and more painful period, similar to those she had before starting Blisovi . She has since resumed consistent use, and her cycles have returned to normal.  She reports knee pain that began approximately three weeks ago, initially causing swelling in her foot. The pain occurs when sitting in certain positions, and she has been using a knee brace for two weeks, which has improved her symptoms. The pain is described as a dull ache around the edges of the  knee, without any locking or buckling. She engages in weight lifting and leg exercises at the gym, which she suspects may have contributed to the knee pain.          The ASCVD Risk score (Arnett DK, et al., 2019) failed to calculate for the following reasons:   The 2019 ASCVD risk score is only valid for ages 26 to 40  Health Maintenance Due  Topic Date Due   HIV Screening  Never done   Hepatitis C Screening  Never done   Hepatitis B Vaccines 19-59 Average Risk (1 of 3 - 19+ 3-dose series) Never done   HPV VACCINES (1 - 3-dose SCDM series) Never done   Influenza Vaccine  Never done   COVID-19 Vaccine (4 - 2025-26 season) 03/05/2024      Objective:     BP 123/88   Pulse 90   Ht 5' 3 (1.6 m)   Wt 201 lb 6.4 oz (91.4 kg)   LMP 07/02/2024 (Exact Date)   SpO2 100%   BMI 35.68 kg/m  {Vitals History (Optional):23777}  Physical Exam   MUSCULOSKELETAL: Knee examination normal, no pain on movement.        No results found for any visits on 07/13/24.      Assessment & Plan:   Flu vaccine need -     Flu vaccine trivalent PF, 6mos and older(Flulaval,Afluria,Fluarix,Fluzone)  Primary hypertension -     Comprehensive metabolic panel with GFR; Future -     Lipid panel; Future  Screening for diabetes  mellitus -     Hemoglobin A1c; Future  Vitamin D  deficiency -     VITAMIN D  25 Hydroxy (Vit-D Deficiency, Fractures); Future  Other orders -     Blisovi  FE 1/20; Take 1 tablet by mouth daily.  Dispense: 28 tablet; Refill: 11 -     Escitalopram  Oxalate; Take 1 tablet (10 mg total) by mouth daily.  Dispense: 60 tablet; Refill: 0 -     Valsartan -hydroCHLOROthiazide ; Take 1 tablet by mouth daily.  Dispense: 90 tablet; Refill: 1    Assessment and Plan    Patellar tendinitis Left knee pain and swelling likely due to patellar tendinitis, exacerbated by certain movements. No trauma or infection signs. - Continue knee brace as needed. - Use pain relievers, topicals, heat,  and ice for symptom management. - Schedule appointment if symptoms recur for further evaluation.  Hypertension Well-controlled with valsartan  and hydrochlorothiazide . Stable blood pressure readings. - Continue valsartan  and hydrochlorothiazide  as prescribed.  Anxiety disorder Recent anxiety-induced chest pain, likely stress-related. Hydroxyzine  prescribed, causing dry mouth. Discussed propranolol as alternative for situational anxiety. - Continue hydroxyzine  as needed for anxiety. - Consider propranolol for situational anxiety if hydroxyzine  is not well tolerated.  General Health Maintenance Routine health maintenance discussed. - Administered flu vaccine. - Ordered lab work for physical examination. - Scheduled follow-up appointment in one year for physical examination.           Return in about 1 year (around 07/13/2025) for physical.    Toribio MARLA Slain, MD  "

## 2024-07-14 ENCOUNTER — Encounter: Payer: Self-pay | Admitting: Family Medicine

## 2024-07-14 DIAGNOSIS — Z0000 Encounter for general adult medical examination without abnormal findings: Secondary | ICD-10-CM | POA: Insufficient documentation

## 2024-07-14 DIAGNOSIS — M25562 Pain in left knee: Secondary | ICD-10-CM | POA: Insufficient documentation

## 2024-07-14 LAB — HEMOGLOBIN A1C
Est. average glucose Bld gHb Est-mCnc: 151 mg/dL
Hgb A1c MFr Bld: 6.9 % — ABNORMAL HIGH (ref 4.8–5.6)

## 2024-07-14 LAB — VITAMIN D 25 HYDROXY (VIT D DEFICIENCY, FRACTURES): Vit D, 25-Hydroxy: 45.6 ng/mL (ref 30.0–100.0)

## 2024-07-14 LAB — COMPREHENSIVE METABOLIC PANEL WITH GFR
ALT: 42 IU/L — ABNORMAL HIGH (ref 0–32)
AST: 38 IU/L (ref 0–40)
Albumin: 4.1 g/dL (ref 3.9–4.9)
Alkaline Phosphatase: 49 IU/L (ref 41–116)
BUN/Creatinine Ratio: 11 (ref 9–23)
BUN: 9 mg/dL (ref 6–20)
Bilirubin Total: 0.5 mg/dL (ref 0.0–1.2)
CO2: 23 mmol/L (ref 20–29)
Calcium: 9.9 mg/dL (ref 8.7–10.2)
Chloride: 103 mmol/L (ref 96–106)
Creatinine, Ser: 0.81 mg/dL (ref 0.57–1.00)
Globulin, Total: 3.7 g/dL (ref 1.5–4.5)
Glucose: 121 mg/dL — ABNORMAL HIGH (ref 70–99)
Potassium: 4.3 mmol/L (ref 3.5–5.2)
Sodium: 141 mmol/L (ref 134–144)
Total Protein: 7.8 g/dL (ref 6.0–8.5)
eGFR: 99 mL/min/1.73

## 2024-07-14 LAB — LIPID PANEL
Chol/HDL Ratio: 3.5 ratio (ref 0.0–4.4)
Cholesterol, Total: 204 mg/dL — ABNORMAL HIGH (ref 100–199)
HDL: 59 mg/dL
LDL Chol Calc (NIH): 122 mg/dL — ABNORMAL HIGH (ref 0–99)
Triglycerides: 129 mg/dL (ref 0–149)
VLDL Cholesterol Cal: 23 mg/dL (ref 5–40)

## 2024-07-14 NOTE — Assessment & Plan Note (Signed)
 Left knee pain and swelling, exacerbated by certain movements. No trauma or infection signs. Resolved about a day ago.  - Continue knee brace as needed. - Use pain relievers, topicals, heat, and ice for symptom management as needed. - Schedule appointment if symptoms recur for further evaluation.

## 2024-07-14 NOTE — Assessment & Plan Note (Signed)
 Routine health maintenance discussed. - Administered flu vaccine. - Ordered lab work for physical examination.

## 2024-07-14 NOTE — Assessment & Plan Note (Signed)
 Well-controlled with valsartan  and hydrochlorothiazide . Stable blood pressure readings. - Continue valsartan  and hydrochlorothiazide  as prescribed.

## 2024-07-14 NOTE — Assessment & Plan Note (Signed)
 Recent anxiety-induced chest pain, likely stress-related. Hydroxyzine  prescribed in the ED, causing dry mouth. Discussed propranolol as alternative for situational anxiety. - Continue hydroxyzine  as needed for anxiety. - Consider propranolol for situational anxiety if hydroxyzine  is not well tolerated. - continue lexapro  10mg 

## 2024-07-17 ENCOUNTER — Ambulatory Visit: Payer: Self-pay | Admitting: Family Medicine

## 2024-07-17 DIAGNOSIS — Z6834 Body mass index (BMI) 34.0-34.9, adult: Secondary | ICD-10-CM

## 2024-07-17 DIAGNOSIS — R7309 Other abnormal glucose: Secondary | ICD-10-CM

## 2024-07-17 NOTE — Telephone Encounter (Signed)
 LVM to return call to go over results and to scan appointment.  Please give results and schedule appt if pt calls back.

## 2024-07-18 ENCOUNTER — Telehealth: Payer: Self-pay

## 2024-07-18 NOTE — Telephone Encounter (Signed)
 Spoke with patient to go over results and scheduled appointment for 10/17/24.

## 2024-07-18 NOTE — Telephone Encounter (Signed)
 Copied from CRM [id number]. Topic: Clinical - Lab/Test Results >> Jul 17, 2024  1:54 PM Emylou G wrote: Reason for CRM: Pls call patient back.. to go over labs and scheduling.

## 2024-07-18 NOTE — Telephone Encounter (Signed)
 Copied from CRM [id number]. Topic: Clinical - Lab/Test Results >> Jul 18, 2024  4:26 PM Rebekah Fields wrote: Reason for CRM: Patient returned office call back regarding lab results. Specialist was going over lab results and patient has further questions regarding her labs. Patient is also needing to be scheduled for future labs per office.   Pulled call. Call ID: [phone]

## 2024-07-18 NOTE — Telephone Encounter (Signed)
 I called the pt.  She understands and has an appt scheduled

## 2024-08-10 ENCOUNTER — Ambulatory Visit: Admitting: Family Medicine

## 2024-10-11 ENCOUNTER — Other Ambulatory Visit

## 2024-10-18 ENCOUNTER — Ambulatory Visit: Admitting: Family Medicine

## 2025-07-08 ENCOUNTER — Other Ambulatory Visit

## 2025-07-15 ENCOUNTER — Encounter: Admitting: Family Medicine
